# Patient Record
Sex: Female | Born: 1975
Health system: Southern US, Community
[De-identification: ages and names within clinical notes are randomized; demographics above are authoritative.]

## PROBLEM LIST (undated history)

## (undated) DIAGNOSIS — G43909 Migraine, unspecified, not intractable, without status migrainosus: Secondary | ICD-10-CM

## (undated) DIAGNOSIS — R350 Frequency of micturition: Secondary | ICD-10-CM

## (undated) DIAGNOSIS — J45909 Unspecified asthma, uncomplicated: Secondary | ICD-10-CM

## (undated) DIAGNOSIS — C801 Malignant (primary) neoplasm, unspecified: Secondary | ICD-10-CM

## (undated) DIAGNOSIS — R351 Nocturia: Secondary | ICD-10-CM

## (undated) DIAGNOSIS — R3915 Urgency of urination: Secondary | ICD-10-CM

## (undated) DIAGNOSIS — F419 Anxiety disorder, unspecified: Secondary | ICD-10-CM

## (undated) DIAGNOSIS — B977 Papillomavirus as the cause of diseases classified elsewhere: Secondary | ICD-10-CM

## (undated) DIAGNOSIS — Z87442 Personal history of urinary calculi: Secondary | ICD-10-CM

## (undated) DIAGNOSIS — Z973 Presence of spectacles and contact lenses: Secondary | ICD-10-CM

## (undated) DIAGNOSIS — G40909 Epilepsy, unspecified, not intractable, without status epilepticus: Secondary | ICD-10-CM

## (undated) DIAGNOSIS — Z9889 Other specified postprocedural states: Secondary | ICD-10-CM

## (undated) DIAGNOSIS — N398 Other specified disorders of urinary system: Secondary | ICD-10-CM

## (undated) DIAGNOSIS — Z8541 Personal history of malignant neoplasm of cervix uteri: Secondary | ICD-10-CM

## (undated) DIAGNOSIS — M1990 Unspecified osteoarthritis, unspecified site: Secondary | ICD-10-CM

## (undated) DIAGNOSIS — R3989 Other symptoms and signs involving the genitourinary system: Secondary | ICD-10-CM

## (undated) DIAGNOSIS — G629 Polyneuropathy, unspecified: Secondary | ICD-10-CM

## (undated) DIAGNOSIS — N281 Cyst of kidney, acquired: Secondary | ICD-10-CM

## (undated) DIAGNOSIS — F909 Attention-deficit hyperactivity disorder, unspecified type: Secondary | ICD-10-CM

## (undated) DIAGNOSIS — Z87820 Personal history of traumatic brain injury: Secondary | ICD-10-CM

## (undated) DIAGNOSIS — Z85820 Personal history of malignant melanoma of skin: Secondary | ICD-10-CM

## (undated) HISTORY — PX: OTHER SURGICAL HISTORY: SHX169

## (undated) HISTORY — DX: Polyneuropathy, unspecified: G62.9

## (undated) HISTORY — PX: TONSILLECTOMY AND ADENOIDECTOMY: SUR1326

## (undated) HISTORY — DX: Migraine, unspecified, not intractable, without status migrainosus: G43.909

## (undated) HISTORY — DX: Epilepsy, unspecified, not intractable, without status epilepticus: G40.909

## (undated) HISTORY — DX: Malignant (primary) neoplasm, unspecified: C80.1

## (undated) HISTORY — PX: CERVICAL CONIZATION W/BX: SHX1330

## (undated) HISTORY — PX: WISDOM TOOTH EXTRACTION: SHX21

---
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
## 1998-09-30 ENCOUNTER — Inpatient Hospital Stay (HOSPITAL_COMMUNITY): Admission: AD | Admit: 1998-09-30 | Discharge: 1998-09-30 | Payer: Self-pay | Admitting: Obstetrics

## 1998-10-02 ENCOUNTER — Ambulatory Visit (HOSPITAL_COMMUNITY): Admission: RE | Admit: 1998-10-02 | Discharge: 1998-10-02 | Payer: Self-pay | Admitting: *Deleted

## 1998-10-04 ENCOUNTER — Ambulatory Visit (HOSPITAL_COMMUNITY): Admission: RE | Admit: 1998-10-04 | Discharge: 1998-10-04 | Payer: Self-pay | Admitting: Obstetrics & Gynecology

## 1998-10-08 ENCOUNTER — Inpatient Hospital Stay (HOSPITAL_COMMUNITY): Admission: AD | Admit: 1998-10-08 | Discharge: 1998-10-08 | Payer: Self-pay | Admitting: Obstetrics & Gynecology

## 1998-10-08 ENCOUNTER — Encounter: Payer: Self-pay | Admitting: Obstetrics & Gynecology

## 1998-10-14 ENCOUNTER — Ambulatory Visit (HOSPITAL_COMMUNITY): Admission: RE | Admit: 1998-10-14 | Discharge: 1998-10-14 | Payer: Self-pay | Admitting: Obstetrics

## 1998-10-25 ENCOUNTER — Other Ambulatory Visit: Admission: RE | Admit: 1998-10-25 | Discharge: 1998-10-25 | Payer: Self-pay | Admitting: Obstetrics & Gynecology

## 1998-10-27 ENCOUNTER — Ambulatory Visit (HOSPITAL_COMMUNITY): Admission: RE | Admit: 1998-10-27 | Discharge: 1998-10-27 | Payer: Self-pay | Admitting: Obstetrics & Gynecology

## 1998-10-27 ENCOUNTER — Encounter: Payer: Self-pay | Admitting: Obstetrics & Gynecology

## 1999-02-09 ENCOUNTER — Inpatient Hospital Stay (HOSPITAL_COMMUNITY): Admission: AD | Admit: 1999-02-09 | Discharge: 1999-02-11 | Payer: Self-pay | Admitting: *Deleted

## 1999-02-10 ENCOUNTER — Encounter: Payer: Self-pay | Admitting: Obstetrics & Gynecology

## 1999-05-03 ENCOUNTER — Inpatient Hospital Stay (HOSPITAL_COMMUNITY): Admission: AD | Admit: 1999-05-03 | Discharge: 1999-05-06 | Payer: Self-pay | Admitting: Obstetrics and Gynecology

## 2000-10-21 ENCOUNTER — Inpatient Hospital Stay (HOSPITAL_COMMUNITY): Admission: AD | Admit: 2000-10-21 | Discharge: 2000-10-21 | Payer: Self-pay | Admitting: Obstetrics & Gynecology

## 2000-12-21 ENCOUNTER — Emergency Department (HOSPITAL_COMMUNITY): Admission: EM | Admit: 2000-12-21 | Discharge: 2000-12-21 | Payer: Self-pay | Admitting: Emergency Medicine

## 2000-12-22 ENCOUNTER — Encounter: Payer: Self-pay | Admitting: Emergency Medicine

## 2000-12-29 ENCOUNTER — Inpatient Hospital Stay (HOSPITAL_COMMUNITY): Admission: AD | Admit: 2000-12-29 | Discharge: 2000-12-29 | Payer: Self-pay | Admitting: *Deleted

## 2001-02-07 ENCOUNTER — Inpatient Hospital Stay (HOSPITAL_COMMUNITY): Admission: AD | Admit: 2001-02-07 | Discharge: 2001-02-07 | Payer: Self-pay | Admitting: Obstetrics & Gynecology

## 2001-02-10 ENCOUNTER — Inpatient Hospital Stay (HOSPITAL_COMMUNITY): Admission: AD | Admit: 2001-02-10 | Discharge: 2001-02-10 | Payer: Self-pay | Admitting: Obstetrics

## 2001-02-12 ENCOUNTER — Other Ambulatory Visit: Admission: RE | Admit: 2001-02-12 | Discharge: 2001-02-12 | Payer: Self-pay | Admitting: Obstetrics & Gynecology

## 2001-02-19 ENCOUNTER — Inpatient Hospital Stay (HOSPITAL_COMMUNITY): Admission: AD | Admit: 2001-02-19 | Discharge: 2001-02-19 | Payer: Self-pay | Admitting: Obstetrics and Gynecology

## 2002-02-18 ENCOUNTER — Emergency Department (HOSPITAL_COMMUNITY): Admission: EM | Admit: 2002-02-18 | Discharge: 2002-02-18 | Payer: Self-pay | Admitting: Emergency Medicine

## 2002-02-18 ENCOUNTER — Encounter: Payer: Self-pay | Admitting: Emergency Medicine

## 2002-04-17 ENCOUNTER — Other Ambulatory Visit: Admission: RE | Admit: 2002-04-17 | Discharge: 2002-04-17 | Payer: Self-pay | Admitting: Obstetrics and Gynecology

## 2002-06-19 ENCOUNTER — Encounter: Admission: RE | Admit: 2002-06-19 | Discharge: 2002-06-19 | Payer: Self-pay | Admitting: *Deleted

## 2002-07-03 ENCOUNTER — Ambulatory Visit (HOSPITAL_COMMUNITY): Admission: RE | Admit: 2002-07-03 | Discharge: 2002-07-03 | Payer: Self-pay

## 2002-07-10 ENCOUNTER — Encounter: Admission: RE | Admit: 2002-07-10 | Discharge: 2002-07-10 | Payer: Self-pay | Admitting: *Deleted

## 2002-07-18 ENCOUNTER — Inpatient Hospital Stay (HOSPITAL_COMMUNITY): Admission: AD | Admit: 2002-07-18 | Discharge: 2002-07-18 | Payer: Self-pay | Admitting: Obstetrics and Gynecology

## 2002-08-04 ENCOUNTER — Inpatient Hospital Stay (HOSPITAL_COMMUNITY): Admission: AD | Admit: 2002-08-04 | Discharge: 2002-08-04 | Payer: Self-pay | Admitting: *Deleted

## 2002-08-14 ENCOUNTER — Encounter: Admission: RE | Admit: 2002-08-14 | Discharge: 2002-08-14 | Payer: Self-pay | Admitting: *Deleted

## 2002-08-21 ENCOUNTER — Inpatient Hospital Stay (HOSPITAL_COMMUNITY): Admission: AD | Admit: 2002-08-21 | Discharge: 2002-08-21 | Payer: Self-pay | Admitting: *Deleted

## 2002-08-23 ENCOUNTER — Encounter: Payer: Self-pay | Admitting: *Deleted

## 2002-08-23 ENCOUNTER — Emergency Department (HOSPITAL_COMMUNITY): Admission: EM | Admit: 2002-08-23 | Discharge: 2002-08-23 | Payer: Self-pay | Admitting: *Deleted

## 2002-09-04 ENCOUNTER — Encounter: Admission: RE | Admit: 2002-09-04 | Discharge: 2002-09-04 | Payer: Self-pay | Admitting: *Deleted

## 2002-10-02 ENCOUNTER — Encounter: Admission: RE | Admit: 2002-10-02 | Discharge: 2002-10-02 | Payer: Self-pay | Admitting: *Deleted

## 2002-10-06 ENCOUNTER — Ambulatory Visit (HOSPITAL_COMMUNITY): Admission: RE | Admit: 2002-10-06 | Discharge: 2002-10-06 | Payer: Self-pay | Admitting: *Deleted

## 2002-10-21 ENCOUNTER — Inpatient Hospital Stay (HOSPITAL_COMMUNITY): Admission: AD | Admit: 2002-10-21 | Discharge: 2002-10-21 | Payer: Self-pay | Admitting: Obstetrics and Gynecology

## 2002-10-23 HISTORY — PX: TUBAL LIGATION: SHX77

## 2002-10-30 ENCOUNTER — Inpatient Hospital Stay (HOSPITAL_COMMUNITY): Admission: AD | Admit: 2002-10-30 | Discharge: 2002-10-30 | Payer: Self-pay | Admitting: Obstetrics and Gynecology

## 2002-11-06 ENCOUNTER — Encounter: Admission: RE | Admit: 2002-11-06 | Discharge: 2002-11-06 | Payer: Self-pay | Admitting: *Deleted

## 2002-11-08 ENCOUNTER — Inpatient Hospital Stay (HOSPITAL_COMMUNITY): Admission: AD | Admit: 2002-11-08 | Discharge: 2002-11-11 | Payer: Self-pay | Admitting: Family Medicine

## 2002-11-12 ENCOUNTER — Encounter: Admission: RE | Admit: 2002-11-12 | Discharge: 2002-12-12 | Payer: Self-pay | Admitting: Certified Nurse Midwife

## 2002-12-13 ENCOUNTER — Encounter: Admission: RE | Admit: 2002-12-13 | Discharge: 2003-01-12 | Payer: Self-pay | Admitting: Certified Nurse Midwife

## 2003-02-11 ENCOUNTER — Encounter: Admission: RE | Admit: 2003-02-11 | Discharge: 2003-03-13 | Payer: Self-pay | Admitting: Certified Nurse Midwife

## 2003-04-13 ENCOUNTER — Encounter: Admission: RE | Admit: 2003-04-13 | Discharge: 2003-05-13 | Payer: Self-pay | Admitting: Certified Nurse Midwife

## 2003-06-13 ENCOUNTER — Encounter: Admission: RE | Admit: 2003-06-13 | Discharge: 2003-07-13 | Payer: Self-pay | Admitting: Neurosurgery

## 2003-07-14 ENCOUNTER — Encounter: Admission: RE | Admit: 2003-07-14 | Discharge: 2003-08-13 | Payer: Self-pay | Admitting: Certified Nurse Midwife

## 2003-09-13 ENCOUNTER — Encounter: Admission: RE | Admit: 2003-09-13 | Discharge: 2003-10-13 | Payer: Self-pay | Admitting: Certified Nurse Midwife

## 2004-10-23 HISTORY — PX: FOOT SURGERY: SHX648

## 2005-10-23 HISTORY — PX: OTHER SURGICAL HISTORY: SHX169

## 2011-04-17 ENCOUNTER — Other Ambulatory Visit: Payer: Self-pay | Admitting: Diagnostic Neuroimaging

## 2011-04-17 DIAGNOSIS — R51 Headache: Secondary | ICD-10-CM

## 2011-04-17 DIAGNOSIS — G43109 Migraine with aura, not intractable, without status migrainosus: Secondary | ICD-10-CM

## 2011-05-02 ENCOUNTER — Ambulatory Visit
Admission: RE | Admit: 2011-05-02 | Discharge: 2011-05-02 | Disposition: A | Discharge status: Home / Self Care | Payer: Medicaid Other | Source: Ambulatory Visit | Attending: Diagnostic Neuroimaging | Admitting: Diagnostic Neuroimaging

## 2011-05-02 DIAGNOSIS — G43109 Migraine with aura, not intractable, without status migrainosus: Secondary | ICD-10-CM

## 2011-05-02 DIAGNOSIS — R51 Headache: Secondary | ICD-10-CM

## 2011-05-02 MED ORDER — GADOBENATE DIMEGLUMINE 529 MG/ML IV SOLN
15.0000 mL | Freq: Once | INTRAVENOUS | Status: AC | PRN
  Administered 2011-05-02: 15 mL via INTRAVENOUS

## 2012-07-05 ENCOUNTER — Other Ambulatory Visit: Payer: Self-pay | Admitting: Neurosurgery

## 2012-07-05 DIAGNOSIS — M48061 Spinal stenosis, lumbar region without neurogenic claudication: Secondary | ICD-10-CM

## 2012-07-12 ENCOUNTER — Inpatient Hospital Stay: Admission: RE | Admit: 2012-07-12 | Payer: Medicaid Other | Source: Ambulatory Visit

## 2012-07-15 ENCOUNTER — Ambulatory Visit
Admission: RE | Admit: 2012-07-15 | Discharge: 2012-07-15 | Disposition: A | Discharge status: Home / Self Care | Payer: Medicaid Other | Source: Ambulatory Visit | Attending: Neurosurgery | Admitting: Neurosurgery

## 2012-07-15 DIAGNOSIS — M48061 Spinal stenosis, lumbar region without neurogenic claudication: Secondary | ICD-10-CM

## 2012-07-18 ENCOUNTER — Other Ambulatory Visit: Payer: Self-pay | Admitting: Urology

## 2012-07-30 ENCOUNTER — Encounter (HOSPITAL_COMMUNITY): Payer: Self-pay | Admitting: Pharmacy Technician

## 2012-08-13 ENCOUNTER — Encounter (HOSPITAL_COMMUNITY): Payer: Self-pay | Admitting: *Deleted

## 2012-08-13 NOTE — Pre-Procedure Instructions (Signed)
Asked to bring blue folder the day of the procedure,insurance card,I.D. driver's license,wear comfortable clothing and have a driver for the day. Asked not to take Advil,Motrin,Ibuprofen,Aleve or any NSAIDS, Aspirin, or Toradol for 72 hours prior to procedure,  No vitamins or herbal medications 7 days prior to procedure. Will stop vitamins today Instructed to take laxative per doctor's office instructions and eat a light dinner the evening before procedure.   To arrive at 0530 for lithotripsy procedure.
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
## 2012-08-19 ENCOUNTER — Ambulatory Visit (HOSPITAL_COMMUNITY)
Admission: RE | Admit: 2012-08-19 | Discharge: 2012-08-19 | Disposition: A | Discharge status: Home / Self Care | Payer: Medicaid Other | Source: Ambulatory Visit | Attending: Urology | Admitting: Urology

## 2012-08-19 ENCOUNTER — Encounter (HOSPITAL_COMMUNITY): Payer: Self-pay | Admitting: *Deleted

## 2012-08-19 ENCOUNTER — Ambulatory Visit (HOSPITAL_COMMUNITY): Payer: Medicaid Other

## 2012-08-19 ENCOUNTER — Encounter (HOSPITAL_COMMUNITY)
Admission: RE | Disposition: A | Discharge status: Home / Self Care | Payer: Self-pay | Source: Ambulatory Visit | Attending: Urology

## 2012-08-19 DIAGNOSIS — N200 Calculus of kidney: Secondary | ICD-10-CM | POA: Insufficient documentation

## 2012-08-19 DIAGNOSIS — Z79899 Other long term (current) drug therapy: Secondary | ICD-10-CM | POA: Insufficient documentation

## 2012-08-19 DIAGNOSIS — Z87440 Personal history of urinary (tract) infections: Secondary | ICD-10-CM | POA: Insufficient documentation

## 2012-08-19 DIAGNOSIS — K219 Gastro-esophageal reflux disease without esophagitis: Secondary | ICD-10-CM | POA: Insufficient documentation

## 2012-08-19 DIAGNOSIS — R82998 Other abnormal findings in urine: Secondary | ICD-10-CM | POA: Insufficient documentation

## 2012-08-19 DIAGNOSIS — E119 Type 2 diabetes mellitus without complications: Secondary | ICD-10-CM | POA: Insufficient documentation

## 2012-08-19 DIAGNOSIS — M545 Low back pain, unspecified: Secondary | ICD-10-CM | POA: Insufficient documentation

## 2012-08-19 HISTORY — DX: Unspecified asthma, uncomplicated: J45.909

## 2012-08-19 HISTORY — DX: Unspecified osteoarthritis, unspecified site: M19.90

## 2012-08-19 LAB — URINE MICROSCOPIC-ADD ON

## 2012-08-19 LAB — URINALYSIS, ROUTINE W REFLEX MICROSCOPIC
Bilirubin Urine: NEGATIVE
Specific Gravity, Urine: 1.03 (ref 1.005–1.030)
Urobilinogen, UA: 0.2 mg/dL (ref 0.0–1.0)
pH: 6.5 (ref 5.0–8.0)

## 2012-08-19 SURGERY — LITHOTRIPSY, ESWL
Anesthesia: LOCAL | Laterality: Right

## 2012-08-19 MED ORDER — DEXTROSE-NACL 5-0.45 % IV SOLN
INTRAVENOUS | Status: DC
  Administered 2012-08-19: 07:00:00 via INTRAVENOUS

## 2012-08-19 MED ORDER — DIPHENHYDRAMINE HCL 25 MG PO CAPS
25.0000 mg | ORAL_CAPSULE | ORAL | Status: AC
  Administered 2012-08-19: 25 mg via ORAL
  Filled 2012-08-19: qty 1

## 2012-08-19 MED ORDER — CIPROFLOXACIN HCL 500 MG PO TABS
500.0000 mg | ORAL_TABLET | ORAL | Status: AC
  Administered 2012-08-19: 500 mg via ORAL
  Filled 2012-08-19: qty 1

## 2012-08-19 MED ORDER — DIAZEPAM 5 MG PO TABS
10.0000 mg | ORAL_TABLET | ORAL | Status: AC
  Administered 2012-08-19: 10 mg via ORAL
  Filled 2012-08-19: qty 2

## 2012-08-19 MED ORDER — NITROFURANTOIN MONOHYD MACRO 100 MG PO CAPS: 100.0000 mg | ORAL_CAPSULE | Freq: Two times a day (BID) | ORAL | Status: DC

## 2012-08-19 NOTE — Op Note (Signed)
Her procedure was cancelled because she went off of her suppressive antibiotic about a week ago and her UA today is worrisome for infection.     She will be sent out on macrobid 100mg  po bid #30 pending the culture and will be rescheduled for the ESWL.

## 2012-08-19 NOTE — Progress Notes (Signed)
Dr. Annabell Howells called from litho truck at approximately 0740 and requested a u/a and culture to be done on patient prior to litho (due to patient not taking her antibiotic up until litho day). Called the lab and they added on above labs to pregnancy test urine sent down this am prior to premed of Cipro. Results available at [phone] and I called them to Dr. Annabell Howells. Per Dr. Annabell Howells verbal order given to d/c patient to home and office will call her to reschedule litho. Patient given new script for Macrobid. Patient able to verbalize back importance of taking antibiotic. She will call office if any problems or questions.

## 2012-08-20 ENCOUNTER — Other Ambulatory Visit: Payer: Self-pay | Admitting: Urology

## 2012-08-23 ENCOUNTER — Encounter (HOSPITAL_COMMUNITY): Payer: Self-pay | Admitting: Pharmacy Technician

## 2012-08-27 ENCOUNTER — Encounter (HOSPITAL_COMMUNITY): Payer: Self-pay | Admitting: *Deleted

## 2012-08-27 NOTE — Pre-Procedure Instructions (Addendum)
Asked to bring blue folder the day of the procedure,insurance card,I.D. driver's license,wear comfortable clothing and have a driver for the day. Asked not to take Advil,Motrin,Ibuprofen,Aleve or any NSAIDS, Aspirin, or Toradol for 72 hours prior to procedure,  No vitamins or herbal medications 7 days prior to procedure. Instructed to take laxative per doctor's office instructions and eat a light dinner the evening before procedure.   To arrive at 0530 for lithotripsy procedure.  Made sure she all instructions were repeated back as I gave them during the pre procedure call all instructions repeated back with clarity.  Dr. Annabell Howells wants her to take her Mircobid Thursday morning before coming in for the procedure because of her history of kidney infections.

## 2012-09-05 ENCOUNTER — Encounter (HOSPITAL_COMMUNITY): Payer: Self-pay | Admitting: *Deleted

## 2012-09-05 ENCOUNTER — Ambulatory Visit (HOSPITAL_COMMUNITY): Payer: Medicaid Other

## 2012-09-05 ENCOUNTER — Encounter (HOSPITAL_COMMUNITY)
Admission: RE | Disposition: A | Discharge status: Home / Self Care | Payer: Self-pay | Source: Ambulatory Visit | Attending: Urology

## 2012-09-05 ENCOUNTER — Ambulatory Visit (HOSPITAL_COMMUNITY)
Admission: RE | Admit: 2012-09-05 | Discharge: 2012-09-05 | Disposition: A | Discharge status: Home / Self Care | Payer: Medicaid Other | Source: Ambulatory Visit | Attending: Urology | Admitting: Urology

## 2012-09-05 DIAGNOSIS — E119 Type 2 diabetes mellitus without complications: Secondary | ICD-10-CM | POA: Insufficient documentation

## 2012-09-05 DIAGNOSIS — J45909 Unspecified asthma, uncomplicated: Secondary | ICD-10-CM | POA: Insufficient documentation

## 2012-09-05 DIAGNOSIS — Z79899 Other long term (current) drug therapy: Secondary | ICD-10-CM | POA: Insufficient documentation

## 2012-09-05 DIAGNOSIS — K219 Gastro-esophageal reflux disease without esophagitis: Secondary | ICD-10-CM | POA: Insufficient documentation

## 2012-09-05 DIAGNOSIS — N200 Calculus of kidney: Secondary | ICD-10-CM | POA: Insufficient documentation

## 2012-09-05 DIAGNOSIS — Z87440 Personal history of urinary (tract) infections: Secondary | ICD-10-CM | POA: Insufficient documentation

## 2012-09-05 HISTORY — PX: EXTRACORPOREAL SHOCK WAVE LITHOTRIPSY: SHX1557

## 2012-09-05 LAB — PREGNANCY, URINE: Preg Test, Ur: NEGATIVE

## 2012-09-05 SURGERY — LITHOTRIPSY, ESWL
Anesthesia: LOCAL | Laterality: Right

## 2012-09-05 MED ORDER — HYDROMORPHONE HCL 2 MG PO TABS: 2.0000 mg | ORAL_TABLET | ORAL | Status: DC | PRN

## 2012-09-05 MED ORDER — DEXTROSE-NACL 5-0.45 % IV SOLN
INTRAVENOUS | Status: DC
  Administered 2012-09-05: 1000 mL via INTRAVENOUS

## 2012-09-05 MED ORDER — DIPHENHYDRAMINE HCL 25 MG PO CAPS
25.0000 mg | ORAL_CAPSULE | ORAL | Status: AC
  Administered 2012-09-05: 25 mg via ORAL
  Filled 2012-09-05: qty 1

## 2012-09-05 MED ORDER — CIPROFLOXACIN HCL 500 MG PO TABS
500.0000 mg | ORAL_TABLET | ORAL | Status: AC
  Administered 2012-09-05: 500 mg via ORAL
  Filled 2012-09-05: qty 1

## 2012-09-05 MED ORDER — DIAZEPAM 5 MG PO TABS
10.0000 mg | ORAL_TABLET | ORAL | Status: AC
  Administered 2012-09-05: 10 mg via ORAL
  Filled 2012-09-05: qty 2

## 2012-09-05 NOTE — Interval H&P Note (Signed)
History and Physical Interval Note:  09/05/2012 7:42 AM  Hannah Moreno  has presented today for surgery, with the diagnosis of RIGHT RENAL STONE  The various methods of treatment have been discussed with the patient and family. After consideration of risks, benefits and other options for treatment, the patient has consented to  Procedure(s) (LRB) with comments: EXTRACORPOREAL SHOCK WAVE LITHOTRIPSY (ESWL) (Right) as a surgical intervention .  The patient's history has been reviewed, patient examined, no change in status, stable for surgery.  I have reviewed the patient's chart and labs.  Questions were answered to the patient's satisfaction.     Tyshana Nishida J  Her culture grew only Mx species
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
## 2012-09-07 ENCOUNTER — Encounter (HOSPITAL_COMMUNITY): Payer: Self-pay | Admitting: Emergency Medicine

## 2012-09-07 ENCOUNTER — Emergency Department (HOSPITAL_COMMUNITY)
Admission: EM | Admit: 2012-09-07 | Discharge: 2012-09-08 | Disposition: A | Discharge status: Home / Self Care | Payer: Medicaid Other | Attending: Emergency Medicine | Admitting: Emergency Medicine

## 2012-09-07 DIAGNOSIS — R300 Dysuria: Secondary | ICD-10-CM | POA: Insufficient documentation

## 2012-09-07 DIAGNOSIS — R109 Unspecified abdominal pain: Secondary | ICD-10-CM

## 2012-09-07 LAB — CBC WITH DIFFERENTIAL/PLATELET
Eosinophils Relative: 2 % (ref 0–5)
HCT: 38.2 % (ref 36.0–46.0)
Hemoglobin: 12.6 g/dL (ref 12.0–15.0)
Lymphocytes Relative: 20 % (ref 12–46)
Lymphs Abs: 1.4 10*3/uL (ref 0.7–4.0)
MCV: 91.6 fL (ref 78.0–100.0)
Monocytes Relative: 7 % (ref 3–12)
Platelets: 285 10*3/uL (ref 150–400)
RBC: 4.17 MIL/uL (ref 3.87–5.11)
WBC: 7.2 10*3/uL (ref 4.0–10.5)

## 2012-09-07 LAB — URINE MICROSCOPIC-ADD ON

## 2012-09-07 LAB — URINALYSIS, ROUTINE W REFLEX MICROSCOPIC
Bilirubin Urine: NEGATIVE
Glucose, UA: NEGATIVE mg/dL
Ketones, ur: NEGATIVE mg/dL
Nitrite: NEGATIVE
Specific Gravity, Urine: 1.017 (ref 1.005–1.030)
pH: 5.5 (ref 5.0–8.0)

## 2012-09-07 LAB — BASIC METABOLIC PANEL
BUN: 10 mg/dL (ref 6–23)
CO2: 24 mEq/L (ref 19–32)
Calcium: 8.8 mg/dL (ref 8.4–10.5)
Glucose, Bld: 69 mg/dL — ABNORMAL LOW (ref 70–99)
Sodium: 136 mEq/L (ref 135–145)

## 2012-09-07 MED ORDER — HYDROMORPHONE HCL PF 1 MG/ML IJ SOLN
1.0000 mg | Freq: Once | INTRAMUSCULAR | Status: AC
  Administered 2012-09-08: 1 mg via INTRAMUSCULAR
  Filled 2012-09-07: qty 1

## 2012-09-07 NOTE — ED Notes (Signed)
Awaiting evaluation by ED provider.  

## 2012-09-07 NOTE — ED Notes (Signed)
Pt alert, arrives from home, c/o painful urination, onset several days ago, recent lithotripsy, resp even unlabored skin pwd, c/o mild flank pain

## 2012-09-07 NOTE — ED Notes (Signed)
Pt states pain since 0400 Friday after lithotripsy Thursday.  Burning and frequency and abdominal cramping with urination, as well as right and left sided flank pain.  Pt denies n/v, and states LBM Wednesday.  Pt states sediment found in urine on Thursday but not since increase in urination with burning and pain.
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
## 2012-09-08 MED ORDER — KETOROLAC TROMETHAMINE 60 MG/2ML IM SOLN
60.0000 mg | Freq: Once | INTRAMUSCULAR | Status: AC
  Administered 2012-09-08: 60 mg via INTRAMUSCULAR
  Filled 2012-09-08: qty 2

## 2012-09-08 MED ORDER — HYDROMORPHONE HCL 2 MG PO TABS: 2.0000 mg | ORAL_TABLET | ORAL | Status: DC | PRN

## 2012-09-08 MED ORDER — IBUPROFEN 800 MG PO TABS: 800.0000 mg | ORAL_TABLET | Freq: Three times a day (TID) | ORAL | Status: DC

## 2012-09-08 NOTE — ED Provider Notes (Signed)
Medical screening examination/treatment/procedure(s) were performed by non-physician practitioner and as supervising physician I was immediately available for consultation/collaboration.    Vida Roller, MD 09/08/12 [phone]

## 2012-10-23 HISTORY — PX: LITHOTRIPSY: SUR834

## 2013-12-29 ENCOUNTER — Encounter (HOSPITAL_COMMUNITY): Payer: Self-pay | Admitting: Emergency Medicine

## 2013-12-29 ENCOUNTER — Emergency Department (HOSPITAL_COMMUNITY): Payer: Medicaid Other

## 2013-12-29 ENCOUNTER — Emergency Department (HOSPITAL_COMMUNITY)
Admission: EM | Admit: 2013-12-29 | Discharge: 2013-12-29 | Disposition: A | Discharge status: Home / Self Care | Payer: Medicaid Other | Attending: Emergency Medicine | Admitting: Emergency Medicine

## 2013-12-29 DIAGNOSIS — Z79899 Other long term (current) drug therapy: Secondary | ICD-10-CM | POA: Insufficient documentation

## 2013-12-29 DIAGNOSIS — N189 Chronic kidney disease, unspecified: Secondary | ICD-10-CM | POA: Insufficient documentation

## 2013-12-29 DIAGNOSIS — G40909 Epilepsy, unspecified, not intractable, without status epilepticus: Secondary | ICD-10-CM | POA: Insufficient documentation

## 2013-12-29 DIAGNOSIS — IMO0002 Reserved for concepts with insufficient information to code with codable children: Secondary | ICD-10-CM | POA: Insufficient documentation

## 2013-12-29 DIAGNOSIS — J45909 Unspecified asthma, uncomplicated: Secondary | ICD-10-CM | POA: Insufficient documentation

## 2013-12-29 DIAGNOSIS — M129 Arthropathy, unspecified: Secondary | ICD-10-CM | POA: Insufficient documentation

## 2013-12-29 DIAGNOSIS — R229 Localized swelling, mass and lump, unspecified: Secondary | ICD-10-CM | POA: Insufficient documentation

## 2013-12-29 DIAGNOSIS — M940 Chondrocostal junction syndrome [Tietze]: Secondary | ICD-10-CM | POA: Insufficient documentation

## 2013-12-29 MED ORDER — CYCLOBENZAPRINE HCL 10 MG PO TABS: 10.0000 mg | ORAL_TABLET | Freq: Two times a day (BID) | ORAL | Status: DC | PRN

## 2013-12-29 MED ORDER — OXYCODONE-ACETAMINOPHEN 5-325 MG PO TABS: 1.0000 | ORAL_TABLET | ORAL | Status: DC | PRN

## 2013-12-29 MED ORDER — IBUPROFEN 600 MG PO TABS: 600.0000 mg | ORAL_TABLET | Freq: Four times a day (QID) | ORAL | Status: DC | PRN

## 2013-12-29 NOTE — ED Notes (Signed)
Pt presents with c/o left shoulder pain and a knot on the upper left part of her chest. Pt says her left shoulder started hurting the end of January, no injury noted. Pt noticed a knot on her chest last week and says that it is painful to touch and painful when she leans back.
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
## 2013-12-29 NOTE — Discharge Instructions (Signed)

## 2014-01-02 ENCOUNTER — Other Ambulatory Visit: Payer: Self-pay | Admitting: Urology

## 2014-01-07 ENCOUNTER — Encounter (HOSPITAL_BASED_OUTPATIENT_CLINIC_OR_DEPARTMENT_OTHER): Payer: Self-pay | Admitting: *Deleted

## 2014-01-09 ENCOUNTER — Encounter (HOSPITAL_BASED_OUTPATIENT_CLINIC_OR_DEPARTMENT_OTHER): Payer: Self-pay | Admitting: *Deleted

## 2014-01-09 NOTE — Progress Notes (Signed)
NPO AFTER MN. ARRIVE AT 0800. NEEDS HG. WILL TAKE PROTONIX, TOPAMAX AND IF NEEDED OXYCODONE AM DOS W/ SIPS OF WATER. WILL BRING RESCUE INHALER.

## 2014-01-14 NOTE — H&P (Signed)
ctive Problems Problems   1. Bladder pain (788.99)  2. Lumbago (724.2)  3. Nephrolithiasis (592.0)  4. Urge incontinence of urine (788.31)  5. History of Urinary Tract Infection (V13.02)  History of Present Illness  Hannah Moreno returns today in f/u from [facility].  She had some right back pain but her UA was clear and a KUB showed no recurrent stones on 3/10.   She has bladder pain with difficulty voiding along with frequency and nocturia.   She was found to have a 200cc capacity bladder with sensory urgency but no instability and pain with filling.   She had a weak valsalva but didn't leak with a pressure to 73cc.   She had a PF of 4cc/sec with a Pdet of 57cm and she strained to void with a pattern that suggests dysfunctional voiding.   She probably has IC.   Past Medical History Problems   1. History of Anxiety (300.00)  2. History of Arthritis (V13.4)  3. History of Asthma (493.90)  4. History of Cervical Cancer (V10.41)  5. History of diabetes mellitus (V12.29)  6. History of esophageal reflux (V12.79)  7. History of kidney stones (V13.01)  8. History of Human papilloma virus infection (079.4)  9. History of Malignant Melanoma Of The Skin (V10.82)  10. History of Nephrolithiasis Of The Right Kidney (V13.01)  11. History of Seizure  12. History of Urinary Tract Infection (V13.02)  Surgical History Problems   1. History of Ankle Surgery  2. History of Biopsy Skin  3. History of Cervical Surgery (Gyn)  4. History of Cervical Surgery (Gyn)  5. History of Knee Surgery  6. History of Leg Repair  7. History of Lithotripsy  8. History of Shoulder Surgery  9. History of Tonsillectomy With Adenoidectomy  Current Meds  1. Allegra CAPS;  Therapy: (Recorded:25Sep2013) to Recorded  2. Biotin TABS;  Therapy: (Recorded:10Jul2014) to Recorded  3. Calcium + D TABS;  Therapy: (Recorded:10Jul2014) to Recorded  4. Cranberry TABS;  Therapy: (Recorded:10Jul2014) to Recorded  5. Flexeril TABS;  Therapy: (Recorded:12Mar2015) to Recorded  6. Ibuprofen 800 MG Oral Tablet; TAKE 1 TABLET EVERY 8 HOURS AS NEEDED;  Therapy: [id number] to (Evaluate:22Mar2014); Last Rx:22Nov2013 Ordered  7. Iron TABS;  Therapy: (Recorded:10Jul2014) to Recorded  8. Melatonin CAPS;  Therapy: (Recorded:10Jul2014) to Recorded  9. Multi-Vitamin TABS;  Therapy: (Recorded:10Jul2014) to Recorded  10. Oxycodone-Acetaminophen 5-325 MG Oral Tablet;   Therapy: (Recorded:25Sep2013) to Recorded  11. Protonix 20 MG Oral Tablet Delayed Release;   Therapy: (Recorded:25Sep2013) to Recorded  12. Ritalin 20 MG Oral Tablet;   Therapy: (Recorded:25Sep2013) to Recorded  13. Singulair 10 MG Oral Tablet;   Therapy: (Recorded:25Sep2013) to Recorded  14. Symbicort 160-4.5 MCG/ACT Inhalation Aerosol;   Therapy: (Recorded:25Sep2013) to Recorded  15. Topiramate 100 MG Oral Tablet;   Therapy: (Recorded:25Sep2013) to Recorded  16. Vitamin B-12 TABS;   Therapy: (Recorded:10Jul2014) to Recorded  17. Vitamin C TABS;   Therapy: (Recorded:10Jul2014) to Recorded  18. Vitamin D TABS;   Therapy: (Recorded:12Mar2015) to Recorded  19. Xanax 0.5 MG Oral Tablet;   Therapy: (Recorded:25Sep2013) to Recorded  20. Zinc TABS;   Therapy: (Recorded:12Mar2015) to Recorded  21. Zyrtec 10 MG TABS;   Therapy: (Recorded:25Sep2013) to Recorded  Allergies Medication   1. Hydrocodone-Acetaminophen CAPS  2. Sulfa Drugs  Family History Problems   1. Family history of Blood In Urine  2. Family history of Breast Cancer (V16.3) : Maternal Grandmother  3. Family history of Colon Cancer (V16.0) : Mother  4. Family  history of Emphysema : Father  5. Family history of Family Health Status - Mother's Age   age 51  6. Family history of Family Health Status Number Of Children   1 son 1 daughter  80. Family history of Kidney Cancer (V16.51) : Maternal Aunt  8. Family history of Liver Cancer : Maternal Aunt  9. Family history of Nephrolithiasis  10.  Family history of Nonfunctioning Kidney : Maternal Aunt  11. Family history of Prostate Cancer (P82.42) : Maternal Uncle  12. Family history of Sickle Cell Trait : Maternal Aunt  13. Family history of Tuberculosis : Father  Social History Problems   1. Denied: History of Alcohol Use  2. Caffeine Use   2 per day  3. Marital History - Single  4. Never A Smoker  5. Unemployed   filing for disability.   Past and social history reviewed and updated.   Review of Systems  Genitourinary: urinary frequency, urinary urgency, nocturia, incontinence, urinary stream starts and stops, incomplete emptying of bladder and initiating urination requires straining.  Constitutional: no fever.  Cardiovascular: chest pain (from musculoskeletal issues).  Respiratory: no shortness of breath.  Musculoskeletal: back pain.    Vitals Vital Signs [Data Includes: Last 1 Day]  Recorded: [id number] 04:18PM  Blood Pressure: 116 / 76 Temperature: 98.5 F Heart Rate: 99  Physical Exam Constitutional: Well nourished and well developed . No acute distress.  Pulmonary: No respiratory distress and normal respiratory rhythm and effort.  Cardiovascular: Heart rate and rhythm are normal . No peripheral edema.    Results/Data  The following images/tracing/specimen were independently visualized:  I have reviewed the [facility] tracing, fluoro and report.    Assessment Assessed   1. Bladder pain (788.99)  2. Urge incontinence of urine (788.31)   She IC/CPPS with dysfunctional voiding but no recurrent stones.   Plan Bladder pain   1. Start: Toviaz 8 MG Oral Tablet Extended Release 24 Hour; Take 1 tablet daily  2. Follow-up Schedule Surgery Office  Follow-up  Status: Hold For - Appointment   Requested for: [phone]   I discussed the option and will get her set up for cystoscopy with HOD, urethral dilation and instillation of pyridium and marcaine.  I reviewed the risks of bleeding, infection, bladder injury,  retention, increased incontinence, persistent pain, thrombotic events and anesthetic complications.    I have given her Toviaz 8mg  samples to see if that will hold her better than the detrol.   Discussion/Summary   CC: Dortha Kern PA.

## 2014-01-15 ENCOUNTER — Encounter (HOSPITAL_BASED_OUTPATIENT_CLINIC_OR_DEPARTMENT_OTHER): Payer: Medicaid Other | Admitting: Anesthesiology

## 2014-01-15 ENCOUNTER — Ambulatory Visit (HOSPITAL_BASED_OUTPATIENT_CLINIC_OR_DEPARTMENT_OTHER): Payer: Medicaid Other | Admitting: Anesthesiology

## 2014-01-15 ENCOUNTER — Ambulatory Visit (HOSPITAL_BASED_OUTPATIENT_CLINIC_OR_DEPARTMENT_OTHER)
Admission: RE | Admit: 2014-01-15 | Discharge: 2014-01-15 | Disposition: A | Discharge status: Home / Self Care | Payer: Medicaid Other | Source: Ambulatory Visit | Attending: Urology | Admitting: Urology

## 2014-01-15 ENCOUNTER — Encounter (HOSPITAL_BASED_OUTPATIENT_CLINIC_OR_DEPARTMENT_OTHER)
Admission: RE | Disposition: A | Discharge status: Home / Self Care | Payer: Self-pay | Source: Ambulatory Visit | Attending: Urology

## 2014-01-15 ENCOUNTER — Encounter (HOSPITAL_BASED_OUTPATIENT_CLINIC_OR_DEPARTMENT_OTHER): Payer: Self-pay

## 2014-01-15 DIAGNOSIS — Z882 Allergy status to sulfonamides status: Secondary | ICD-10-CM | POA: Insufficient documentation

## 2014-01-15 DIAGNOSIS — Z79899 Other long term (current) drug therapy: Secondary | ICD-10-CM | POA: Insufficient documentation

## 2014-01-15 DIAGNOSIS — E119 Type 2 diabetes mellitus without complications: Secondary | ICD-10-CM | POA: Insufficient documentation

## 2014-01-15 DIAGNOSIS — Z87440 Personal history of urinary (tract) infections: Secondary | ICD-10-CM | POA: Insufficient documentation

## 2014-01-15 DIAGNOSIS — J45909 Unspecified asthma, uncomplicated: Secondary | ICD-10-CM | POA: Insufficient documentation

## 2014-01-15 DIAGNOSIS — M545 Low back pain, unspecified: Secondary | ICD-10-CM | POA: Insufficient documentation

## 2014-01-15 DIAGNOSIS — Z85820 Personal history of malignant melanoma of skin: Secondary | ICD-10-CM | POA: Insufficient documentation

## 2014-01-15 DIAGNOSIS — Z8541 Personal history of malignant neoplasm of cervix uteri: Secondary | ICD-10-CM | POA: Insufficient documentation

## 2014-01-15 DIAGNOSIS — R351 Nocturia: Secondary | ICD-10-CM | POA: Insufficient documentation

## 2014-01-15 DIAGNOSIS — N3010 Interstitial cystitis (chronic) without hematuria: Secondary | ICD-10-CM | POA: Insufficient documentation

## 2014-01-15 DIAGNOSIS — N200 Calculus of kidney: Secondary | ICD-10-CM | POA: Insufficient documentation

## 2014-01-15 DIAGNOSIS — K219 Gastro-esophageal reflux disease without esophagitis: Secondary | ICD-10-CM | POA: Insufficient documentation

## 2014-01-15 DIAGNOSIS — N3941 Urge incontinence: Secondary | ICD-10-CM | POA: Insufficient documentation

## 2014-01-15 HISTORY — DX: Papillomavirus as the cause of diseases classified elsewhere: B97.7

## 2014-01-15 HISTORY — DX: Personal history of urinary calculi: Z87.442

## 2014-01-15 HISTORY — DX: Frequency of micturition: R35.0

## 2014-01-15 HISTORY — PX: CYSTO WITH HYDRODISTENSION: SHX5453

## 2014-01-15 HISTORY — DX: Other specified postprocedural states: Z98.890

## 2014-01-15 HISTORY — DX: Cyst of kidney, acquired: N28.1

## 2014-01-15 HISTORY — DX: Anxiety disorder, unspecified: F41.9

## 2014-01-15 HISTORY — DX: Urgency of urination: R39.15

## 2014-01-15 HISTORY — DX: Presence of spectacles and contact lenses: Z97.3

## 2014-01-15 HISTORY — DX: Other specified disorders of urinary system: N39.8

## 2014-01-15 HISTORY — DX: Other specified postprocedural states: Z85.820

## 2014-01-15 HISTORY — DX: Attention-deficit hyperactivity disorder, unspecified type: F90.9

## 2014-01-15 HISTORY — PX: CYSTOSCOPY WITH URETHRAL DILATATION: SHX5125

## 2014-01-15 HISTORY — DX: Other symptoms and signs involving the genitourinary system: R39.89

## 2014-01-15 HISTORY — DX: Personal history of malignant neoplasm of cervix uteri: Z85.41

## 2014-01-15 HISTORY — DX: Migraine, unspecified, not intractable, without status migrainosus: G43.909

## 2014-01-15 HISTORY — DX: Nocturia: R35.1

## 2014-01-15 HISTORY — DX: Personal history of traumatic brain injury: Z87.820

## 2014-01-15 LAB — POCT HEMOGLOBIN-HEMACUE: Hemoglobin: 13 g/dL (ref 12.0–15.0)

## 2014-01-15 SURGERY — CYSTOSCOPY, WITH URETHRAL DILATION
Anesthesia: General | Site: Ureter

## 2014-01-15 MED ORDER — MIDAZOLAM HCL 2 MG/2ML IJ SOLN
INTRAMUSCULAR | Status: AC
  Filled 2014-01-15: qty 2

## 2014-01-15 MED ORDER — CIPROFLOXACIN IN D5W 400 MG/200ML IV SOLN
INTRAVENOUS | Status: DC | PRN
  Administered 2014-01-15: 400 mg via INTRAVENOUS

## 2014-01-15 MED ORDER — FENTANYL CITRATE 0.05 MG/ML IJ SOLN
25.0000 ug | INTRAMUSCULAR | Status: DC | PRN
  Filled 2014-01-15: qty 1

## 2014-01-15 MED ORDER — ACETAMINOPHEN 325 MG PO TABS
650.0000 mg | ORAL_TABLET | ORAL | Status: DC | PRN
  Filled 2014-01-15: qty 2

## 2014-01-15 MED ORDER — OXYCODONE-ACETAMINOPHEN 5-325 MG PO TABS: 1.0000 | ORAL_TABLET | ORAL | Status: DC | PRN

## 2014-01-15 MED ORDER — PHENAZOPYRIDINE HCL 200 MG PO TABS
ORAL | Status: DC | PRN
  Administered 2014-01-15: 10:00:00 via INTRAVESICAL

## 2014-01-15 MED ORDER — MIDAZOLAM HCL 5 MG/5ML IJ SOLN
INTRAMUSCULAR | Status: DC | PRN
  Administered 2014-01-15: 2 mg via INTRAVENOUS

## 2014-01-15 MED ORDER — SODIUM CHLORIDE 0.9 % IJ SOLN
3.0000 mL | INTRAMUSCULAR | Status: DC | PRN
  Filled 2014-01-15: qty 3

## 2014-01-15 MED ORDER — LACTATED RINGERS IV SOLN
INTRAVENOUS | Status: DC
  Administered 2014-01-15: 09:00:00 via INTRAVENOUS
  Filled 2014-01-15: qty 1000

## 2014-01-15 MED ORDER — CIPROFLOXACIN HCL 500 MG PO TABS: 500.0000 mg | ORAL_TABLET | Freq: Two times a day (BID) | ORAL | Status: DC

## 2014-01-15 MED ORDER — KETOROLAC TROMETHAMINE 30 MG/ML IJ SOLN
INTRAMUSCULAR | Status: DC | PRN
  Administered 2014-01-15: 30 mg via INTRAVENOUS

## 2014-01-15 MED ORDER — SODIUM CHLORIDE 0.9 % IV SOLN
250.0000 mL | INTRAVENOUS | Status: DC | PRN
  Filled 2014-01-15: qty 250

## 2014-01-15 MED ORDER — FENTANYL CITRATE 0.05 MG/ML IJ SOLN
INTRAMUSCULAR | Status: AC
  Filled 2014-01-15: qty 4

## 2014-01-15 MED ORDER — ACETAMINOPHEN 650 MG RE SUPP
650.0000 mg | RECTAL | Status: DC | PRN
  Filled 2014-01-15: qty 1

## 2014-01-15 MED ORDER — FENTANYL CITRATE 0.05 MG/ML IJ SOLN
INTRAMUSCULAR | Status: DC | PRN
  Administered 2014-01-15 (×4): 25 ug via INTRAVENOUS
  Administered 2014-01-15: 50 ug via INTRAVENOUS
  Administered 2014-01-15 (×2): 25 ug via INTRAVENOUS

## 2014-01-15 MED ORDER — PROPOFOL 10 MG/ML IV BOLUS
INTRAVENOUS | Status: DC | PRN
  Administered 2014-01-15: 200 mg via INTRAVENOUS

## 2014-01-15 MED ORDER — LIDOCAINE HCL (CARDIAC) 20 MG/ML IV SOLN
INTRAVENOUS | Status: DC | PRN
  Administered 2014-01-15: 100 mg via INTRAVENOUS

## 2014-01-15 MED ORDER — SODIUM CHLORIDE 0.9 % IJ SOLN
3.0000 mL | Freq: Two times a day (BID) | INTRAMUSCULAR | Status: DC
  Filled 2014-01-15: qty 3

## 2014-01-15 MED ORDER — GLYCOPYRROLATE 0.2 MG/ML IJ SOLN
INTRAMUSCULAR | Status: DC | PRN
  Administered 2014-01-15: 0.2 mg via INTRAVENOUS

## 2014-01-15 MED ORDER — OXYCODONE HCL 5 MG PO TABS
5.0000 mg | ORAL_TABLET | ORAL | Status: DC | PRN
  Filled 2014-01-15: qty 2

## 2014-01-15 MED ORDER — CIPROFLOXACIN IN D5W 400 MG/200ML IV SOLN
400.0000 mg | INTRAVENOUS | Status: DC
  Filled 2014-01-15: qty 200

## 2014-01-15 MED ORDER — DEXAMETHASONE SODIUM PHOSPHATE 4 MG/ML IJ SOLN
INTRAMUSCULAR | Status: DC | PRN
  Administered 2014-01-15: 10 mg via INTRAVENOUS

## 2014-01-15 MED ORDER — KETOROLAC TROMETHAMINE 30 MG/ML IJ SOLN
15.0000 mg | Freq: Once | INTRAMUSCULAR | Status: DC | PRN
  Filled 2014-01-15: qty 1

## 2014-01-15 MED ORDER — PROMETHAZINE HCL 25 MG/ML IJ SOLN
6.2500 mg | INTRAMUSCULAR | Status: DC | PRN
  Filled 2014-01-15: qty 1

## 2014-01-15 MED ORDER — PHENAZOPYRIDINE HCL 200 MG PO TABS: 200.0000 mg | ORAL_TABLET | Freq: Three times a day (TID) | ORAL | Status: DC | PRN

## 2014-01-15 MED ORDER — ONDANSETRON HCL 4 MG/2ML IJ SOLN
INTRAMUSCULAR | Status: DC | PRN
  Administered 2014-01-15: 4 mg via INTRAVENOUS

## 2014-01-15 MED ORDER — STERILE WATER FOR IRRIGATION IR SOLN
Status: DC | PRN
  Administered 2014-01-15: 3000 mL

## 2014-01-15 MED ORDER — LACTATED RINGERS IV SOLN
INTRAVENOUS | Status: DC | PRN
  Administered 2014-01-15: 09:00:00 via INTRAVENOUS

## 2014-01-15 SURGICAL SUPPLY — 29 items
BAG DRAIN URO-CYSTO SKYTR STRL (DRAIN) ×4 IMPLANT
BAG DRN UROCATH (DRAIN) ×2
BALLN NEPHROSTOMY (BALLOONS) ×4
BALLOON NEPHROSTOMY (BALLOONS) ×2 IMPLANT
CANISTER SUCT LVC 12 LTR MEDI- (MISCELLANEOUS) ×2 IMPLANT
CATH FOLEY 2WAY SLVR  5CC 16FR (CATHETERS)
CATH FOLEY 2WAY SLVR 5CC 16FR (CATHETERS) IMPLANT
CATH ROBINSON RED A/P 16FR (CATHETERS) ×2 IMPLANT
CLOTH BEACON ORANGE TIMEOUT ST (SAFETY) ×4 IMPLANT
DRAPE CAMERA CLOSED 9X96 (DRAPES) ×4 IMPLANT
ELECT REM PT RETURN 9FT ADLT (ELECTROSURGICAL)
ELECTRODE REM PT RTRN 9FT ADLT (ELECTROSURGICAL) ×2 IMPLANT
GLOVE BIOGEL M 6.5 STRL (GLOVE) ×2 IMPLANT
GLOVE BIOGEL PI IND STRL 6.5 (GLOVE) IMPLANT
GLOVE BIOGEL PI INDICATOR 6.5 (GLOVE) ×2
GLOVE SURG SS PI 8.0 STRL IVOR (GLOVE) ×4 IMPLANT
GOWN PREVENTION PLUS LG XLONG (DISPOSABLE) ×2 IMPLANT
GOWN STRL REIN XL XLG (GOWN DISPOSABLE) ×2 IMPLANT
GOWN STRL REUS W/TWL LRG LVL3 (GOWN DISPOSABLE) ×2 IMPLANT
GOWN STRL REUS W/TWL XL LVL3 (GOWN DISPOSABLE) ×2 IMPLANT
GUIDEWIRE STR DUAL SENSOR (WIRE) IMPLANT
NDL SAFETY ECLIPSE 18X1.5 (NEEDLE) ×2 IMPLANT
NEEDLE HYPO 18GX1.5 SHARP (NEEDLE) ×4
NEEDLE HYPO 22GX1.5 SAFETY (NEEDLE) IMPLANT
NS IRRIG 500ML POUR BTL (IV SOLUTION) IMPLANT
PACK CYSTOSCOPY (CUSTOM PROCEDURE TRAY) ×4 IMPLANT
SYR 20CC LL (SYRINGE) IMPLANT
SYR 30ML LL (SYRINGE) ×4 IMPLANT
WATER STERILE IRR 3000ML UROMA (IV SOLUTION) ×4 IMPLANT

## 2014-01-15 NOTE — Brief Op Note (Signed)
01/15/2014  10:20 AM  PATIENT:  Hannah Moreno  38 y.o. female  PRE-OPERATIVE DIAGNOSIS:  PAINFUL BLADDER/DISFUNCTIONAL VOIDING  POST-OPERATIVE DIAGNOSIS:  BLADDER/DISFUNCTIONAL VOIDING  PROCEDURE:  Procedure(s): CYSTOSCOPY WITH URETHRAL DILATATION (N/A) HYDRODISTENSION INSTALLATION OF PYRIDIUM AND MARCAINE    (N/A)  SURGEON:  Surgeon(s) and Role:    * Irine Seal, MD - Primary  PHYSICIAN ASSISTANT:   ASSISTANTS: none   ANESTHESIA:   general  EBL:  Total I/O In: 200 [I.V.:200] Out: 650 [Urine:650]  BLOOD ADMINISTERED:none  DRAINS: none   LOCAL MEDICATIONS USED:  MARCAINE     SPECIMEN:  No Specimen  DISPOSITION OF SPECIMEN:  N/A  COUNTS:  YES  TOURNIQUET:  * No tourniquets in log *  DICTATION: .Other Dictation: Dictation Number [id number]  PLAN OF CARE: Discharge to home after PACU  PATIENT DISPOSITION:  PACU - hemodynamically stable.   Delay start of Pharmacological VTE agent (>24hrs) due to surgical blood loss or risk of bleeding: not applicable

## 2014-01-15 NOTE — Anesthesia Postprocedure Evaluation (Signed)
  Anesthesia Post-op Note  Patient: Hannah Moreno  Procedure(s) Performed: Procedure(s) (LRB): CYSTOSCOPY WITH URETHRAL DILATATION (N/A) HYDRODISTENSION INSTALLATION OF PYRIDIUM AND MARCAINE    (N/A)  Patient Location: PACU  Anesthesia Type: General  Level of Consciousness: awake and alert   Airway and Oxygen Therapy: Patient Spontanous Breathing  Post-op Pain: mild  Post-op Assessment: Post-op Vital signs reviewed, Patient's Cardiovascular Status Stable, Respiratory Function Stable, Patent Airway and No signs of Nausea or vomiting  Last Vitals:  Filed Vitals:   01/15/14 1045  BP: 118/88  Pulse: 72  Temp:   Resp: 10    Post-op Vital Signs: stable   Complications: No apparent anesthesia complications

## 2014-01-15 NOTE — Discharge Instructions (Signed)
Cystoscopy, Care After °Refer to this sheet in the next few weeks. These instructions provide you with information on caring for yourself after your procedure. Your caregiver may also give you more specific instructions. Your treatment has been planned according to current medical practices, but problems sometimes occur. Call your caregiver if you have any problems or questions after your procedure. °HOME CARE INSTRUCTIONS  °Things you can do to ease any discomfort after your procedure include: °· Drinking enough water and fluids to keep your urine clear or pale yellow. °· Taking a warm bath to relieve any burning feelings. °SEEK IMMEDIATE MEDICAL CARE IF:  °· You have an increase in blood in your urine. °· You notice blood clots in your urine. °· You have difficulty passing urine. °· You have the chills. °· You have abdominal pain. °· You have a fever or persistent symptoms for more than 2 3 days. °· You have a fever and your symptoms suddenly get worse. °MAKE SURE YOU:  °· Understand these instructions. °· Will watch your condition. °· Will get help right away if you are not doing well or get worse. °Document Released: 04/28/2005 Document Revised: 06/11/2013 Document Reviewed: 04/01/2012 °ExitCare® Patient Information ©2014 [employer]. ° °Post Anesthesia Home Care Instructions ° °Activity: °Get plenty of rest for the remainder of the day. A responsible adult should stay with you for 24 hours following the procedure.  °For the next 24 hours, DO NOT: °-Drive a car °-Operate machinery °-Drink alcoholic beverages °-Take any medication unless instructed by your physician °-Make any legal decisions or sign important papers. ° °Meals: °Start with liquid foods such as gelatin or soup. Progress to regular foods as tolerated. Avoid greasy, spicy, heavy foods. If nausea and/or vomiting occur, drink only clear liquids until the nausea and/or vomiting subsides. Call your physician if vomiting continues. ° °Special  Instructions/Symptoms: °Your throat may feel dry or sore from the anesthesia or the breathing tube placed in your throat during surgery. If this causes discomfort, gargle with warm salt water. The discomfort should disappear within 24 hours. ° °

## 2014-01-15 NOTE — Transfer of Care (Signed)
Immediate Anesthesia Transfer of Care Note  Patient: Hannah Moreno  Procedure(s) Performed: Procedure(s) (LRB): CYSTOSCOPY WITH URETHRAL DILATATION (N/A) HYDRODISTENSION INSTALLATION OF PYRIDIUM AND MARCAINE    (N/A)  Patient Location: PACU  Anesthesia Type: General  Level of Consciousness: awake, sedated, patient cooperative and responds to stimulation  Airway & Oxygen Therapy: Patient Spontanous Breathing and Patient connected to face mask oxygen  Post-op Assessment: Report given to PACU RN, Post -op Vital signs reviewed and stable and Patient moving all extremities  Post vital signs: Reviewed and stable  Complications: No apparent anesthesia complications

## 2014-01-15 NOTE — Anesthesia Procedure Notes (Signed)
Procedure Name: LMA Insertion Date/Time: 01/15/2014 10:05 AM Performed by: Justice Rocher Pre-anesthesia Checklist: Patient identified, Emergency Drugs available, Suction available and Patient being monitored Patient Re-evaluated:Patient Re-evaluated prior to inductionOxygen Delivery Method: Circle System Utilized Preoxygenation: Pre-oxygenation with 100% oxygen Intubation Type: IV induction Ventilation: Mask ventilation without difficulty LMA: LMA inserted LMA Size: 4.0 Number of attempts: 1 Airway Equipment and Method: bite block Placement Confirmation: positive ETCO2 Tube secured with: Tape Dental Injury: Teeth and Oropharynx as per pre-operative assessment

## 2014-01-15 NOTE — Anesthesia Preprocedure Evaluation (Addendum)
Anesthesia Evaluation  Patient identified by MRN, date of birth, ID band Patient awake    Reviewed: Allergy & Precautions, H&P , NPO status , Patient's Chart, lab work & pertinent test results  Airway Mallampati: II TM Distance: >3 FB Neck ROM: Full    Dental no notable dental hx.    Pulmonary asthma ,  breath sounds clear to auscultation  Pulmonary exam normal       Cardiovascular negative cardio ROS  Rhythm:Regular Rate:Normal     Neuro/Psych negative neurological ROS  negative psych ROS   GI/Hepatic negative GI ROS, Neg liver ROS,   Endo/Other  negative endocrine ROS  Renal/GU negative Renal ROS  negative genitourinary   Musculoskeletal negative musculoskeletal ROS (+)   Abdominal   Peds negative pediatric ROS (+)  Hematology negative hematology ROS (+)   Anesthesia Other Findings multiiple pierciings, B ears, umbilicus, tongue. Pt. Claims none are removable. Informed of risk, albeit low for this procedure, and insertion of breathing device could disrupt tongue piercing.  Reproductive/Obstetrics negative OB ROS                          Anesthesia Physical Anesthesia Plan  ASA: II  Anesthesia Plan: General   Post-op Pain Management:    Induction: Intravenous  Airway Management Planned: LMA  Additional Equipment:   Intra-op Plan:   Post-operative Plan:   Informed Consent: I have reviewed the patients History and Physical, chart, labs and discussed the procedure including the risks, benefits and alternatives for the proposed anesthesia with the patient or authorized representative who has indicated his/her understanding and acceptance.   Dental advisory given  Plan Discussed with: CRNA and Surgeon  Anesthesia Plan Comments:         Anesthesia Quick Evaluation

## 2014-01-15 NOTE — Progress Notes (Signed)
Dr. Jeffie Pollock paged and called back, reported unable to void, bladder scan 444 ml. Inserted #16 using sterile technique with immediate return of 700 ml orange urine with no problems.

## 2014-01-15 NOTE — Interval H&P Note (Signed)
History and Physical Interval Note:  01/15/2014 9:11 AM  Hannah Moreno  has presented today for surgery, with the diagnosis of PAINFUL BLADDER/DISFUNCTIONAL VOIDING  The various methods of treatment have been discussed with the patient and family. After consideration of risks, benefits and other options for treatment, the patient has consented to  Procedure(s): CYSTOSCOPY WITH URETHRAL DILATATION (N/A) HYDRODISTENSION INSTALLATION OF PYRIDIUM AND MARCAINE    (N/A) as a surgical intervention .  The patient's history has been reviewed, patient examined, no change in status, stable for surgery.  I have reviewed the patient's chart and labs.  Questions were answered to the patient's satisfaction.     Hannah Moreno

## 2014-01-16 ENCOUNTER — Encounter (HOSPITAL_BASED_OUTPATIENT_CLINIC_OR_DEPARTMENT_OTHER): Payer: Self-pay | Admitting: Urology

## 2014-01-16 NOTE — Op Note (Signed)
NAMEDARLEN, GLEDHILL NO.:  [id number]  MEDICAL RECORD NO.:  [id number]  LOCATION:                                 FACILITY:  PHYSICIAN:  Marshall Cork. Jeffie Pollock, M.D.    DATE OF BIRTH:  12-15-1975  DATE OF PROCEDURE:  01/15/2014 DATE OF DISCHARGE:                              OPERATIVE REPORT   PROCEDURE: 1. Cystoscopy with hydrodistention of the bladder. 2. Urethral dilation. 3. Instillation of Pyridium and Marcaine.  PREOPERATIVE DIAGNOSIS:  Painful bladder with dysfunctional voiding.  POSTOPERATIVE DIAGNOSIS:  Painful bladder with dysfunctional voiding with interstitial cystitis.  SURGEON:  Marshall Cork. Jeffie Pollock, M.D.  ANESTHESIA:  General.  SPECIMEN:  None.  DRAINS:  None.  COMPLICATIONS:  None.  INDICATIONS:  Ms. Lahmann is a 38 year old white female with a history of painful bladder and probable dysfunctional voiding.  Her capacity at the time of urodynamics was approximately 200 mL with a stable bladder.  FINDINGS AND PROCEDURE:  She was given Cipro and taken to the operating room where general anesthetic was induced.  She was placed in lithotomy position and fitted with PAS hose.  Her perineum and genitalia were prepped with Betadine solution, and she was draped in usual sterile fashion.  Cystoscopy was performed using a 22-French scope and 12-degree lens. Examination revealed a normal urethra.  The bladder wall was smooth and pale without tumor, stones, or inflammation.  Ureteral orifices were unremarkable.  After initial cystoscopy, the bladder was filled under 80 cm water pressure to capacity.  This was held for 3 minutes, and the bladder was drained.  Her capacity under anesthesia was only 650 mL, and the terminal efflux was bloody.  Inspection following hydrodistention revealed diffuse glomerulations, but no bladder wall cracks.  The findings were consistent with interstitial cystitis.  The cystoscope was removed and the urethra was calibrated  from 24 to 36- [place] with female sounds.  She was tight at about 26-French.  The bladder was drained with the 32-French sound, and the bladder was then instilled with 30 mL of 0.25% Marcaine with 400 mg of crushed Pyridium, which was left indwelling in the bladder.  The patient then taken down from lithotomy position.  Her anesthetic was reversed.  She was moved to recovery room in stable condition.  There were no complications.     Marshall Cork. Jeffie Pollock, M.D.     JJW/MEDQ  D:  01/15/2014  T:  01/16/2014  Job:  [id number]

## 2014-02-17 ENCOUNTER — Ambulatory Visit: Payer: Medicaid Other | Admitting: Physical Therapy

## 2014-09-21 ENCOUNTER — Emergency Department (HOSPITAL_COMMUNITY): Payer: Medicaid Other

## 2014-09-21 ENCOUNTER — Emergency Department (HOSPITAL_COMMUNITY)
Admission: EM | Admit: 2014-09-21 | Discharge: 2014-09-22 | Disposition: A | Discharge status: Home / Self Care | Payer: Medicaid Other | Attending: Emergency Medicine | Admitting: Emergency Medicine

## 2014-09-21 ENCOUNTER — Encounter (HOSPITAL_COMMUNITY): Payer: Self-pay

## 2014-09-21 DIAGNOSIS — Z8541 Personal history of malignant neoplasm of cervix uteri: Secondary | ICD-10-CM | POA: Diagnosis not present

## 2014-09-21 DIAGNOSIS — Z87828 Personal history of other (healed) physical injury and trauma: Secondary | ICD-10-CM | POA: Insufficient documentation

## 2014-09-21 DIAGNOSIS — N3090 Cystitis, unspecified without hematuria: Secondary | ICD-10-CM | POA: Diagnosis present

## 2014-09-21 DIAGNOSIS — R339 Retention of urine, unspecified: Secondary | ICD-10-CM | POA: Diagnosis not present

## 2014-09-21 DIAGNOSIS — G43909 Migraine, unspecified, not intractable, without status migrainosus: Secondary | ICD-10-CM | POA: Diagnosis not present

## 2014-09-21 DIAGNOSIS — Z79899 Other long term (current) drug therapy: Secondary | ICD-10-CM | POA: Insufficient documentation

## 2014-09-21 DIAGNOSIS — M1990 Unspecified osteoarthritis, unspecified site: Secondary | ICD-10-CM | POA: Diagnosis not present

## 2014-09-21 DIAGNOSIS — F909 Attention-deficit hyperactivity disorder, unspecified type: Secondary | ICD-10-CM | POA: Diagnosis not present

## 2014-09-21 DIAGNOSIS — J45909 Unspecified asthma, uncomplicated: Secondary | ICD-10-CM | POA: Insufficient documentation

## 2014-09-21 LAB — CBC WITH DIFFERENTIAL/PLATELET
BASOS ABS: 0 10*3/uL (ref 0.0–0.1)
Basophils Relative: 1 % (ref 0–1)
Eosinophils Absolute: 0.2 10*3/uL (ref 0.0–0.7)
Eosinophils Relative: 3 % (ref 0–5)
HCT: 38.1 % (ref 36.0–46.0)
HEMOGLOBIN: 12.2 g/dL (ref 12.0–15.0)
LUC, ABSOLUTE: 0 10*3/uL (ref 0.0–0.5)
LUCs, %: 0 % (ref 0–4)
LYMPHS PCT: 25 % (ref 12–46)
Lymphs Abs: 1.7 10*3/uL (ref 0.7–4.0)
MCH: 30.8 pg (ref 26.0–34.0)
MCHC: 32 g/dL (ref 30.0–36.0)
MCV: 96.2 fL (ref 78.0–100.0)
MONOS PCT: 8 % (ref 3–12)
Monocytes Absolute: 0.5 10*3/uL (ref 0.1–1.0)
NEUTROS ABS: 4.2 10*3/uL (ref 1.7–7.7)
Neutrophils Relative %: 63 % (ref 43–77)
Platelets: 226 10*3/uL (ref 150–400)
RBC: 3.96 MIL/uL (ref 3.87–5.11)
RDW: 12.4 % (ref 11.5–15.5)
WBC: 6.7 10*3/uL (ref 4.0–10.5)

## 2014-09-21 LAB — BASIC METABOLIC PANEL
ANION GAP: 12 (ref 5–15)
BUN: 19 mg/dL (ref 6–23)
CO2: 24 mEq/L (ref 19–32)
Calcium: 8.9 mg/dL (ref 8.4–10.5)
Chloride: 104 mEq/L (ref 96–112)
Creatinine, Ser: 0.89 mg/dL (ref 0.50–1.10)
GFR calc Af Amer: >90 mL/min (ref >90)
GFR, EST NON AFRICAN AMERICAN: 82 mL/min — AB (ref >90)
Glucose, Bld: 100 mg/dL — ABNORMAL HIGH (ref 70–99)
Potassium: 3.4 mEq/L — ABNORMAL LOW (ref 3.7–5.3)
SODIUM: 140 meq/L (ref 137–147)

## 2014-09-21 LAB — URINALYSIS, ROUTINE W REFLEX MICROSCOPIC
Glucose, UA: NEGATIVE mg/dL
HGB URINE DIPSTICK: NEGATIVE
Ketones, ur: 15 mg/dL — AB
Nitrite: POSITIVE — AB
Protein, ur: NEGATIVE mg/dL
Specific Gravity, Urine: 1.017 (ref 1.005–1.030)
UROBILINOGEN UA: 2 mg/dL — AB (ref 0.0–1.0)
pH: 5 (ref 5.0–8.0)

## 2014-09-21 LAB — URINE MICROSCOPIC-ADD ON

## 2014-09-21 LAB — I-STAT BETA HCG BLOOD, ED (MC, WL, AP ONLY): I-stat hCG, quantitative: <5 m[IU]/mL (ref <5)

## 2014-09-21 MED ORDER — HYDROMORPHONE HCL 1 MG/ML IJ SOLN
0.5000 mg | INTRAMUSCULAR | Status: DC | PRN
  Administered 2014-09-21: 0.5 mg via INTRAVENOUS
  Filled 2014-09-21: qty 1

## 2014-09-21 MED ORDER — SODIUM CHLORIDE 0.9 % IV SOLN
1000.0000 mL | INTRAVENOUS | Status: DC
  Administered 2014-09-21: 1000 mL via INTRAVENOUS

## 2014-09-21 MED ORDER — CEPHALEXIN 500 MG PO CAPS: 500.0000 mg | ORAL_CAPSULE | Freq: Four times a day (QID) | ORAL | Status: DC

## 2014-09-21 MED ORDER — ONDANSETRON HCL 4 MG/2ML IJ SOLN
4.0000 mg | Freq: Once | INTRAMUSCULAR | Status: AC
  Administered 2014-09-21: 4 mg via INTRAVENOUS
  Filled 2014-09-21: qty 2

## 2014-09-21 MED ORDER — SODIUM CHLORIDE 0.9 % IV SOLN
1000.0000 mL | Freq: Once | INTRAVENOUS | Status: AC
  Administered 2014-09-21: 1000 mL via INTRAVENOUS

## 2014-09-21 NOTE — ED Notes (Signed)
Pt is eating and drinking without any problem.  Pt's mother at bedside.

## 2014-09-21 NOTE — ED Notes (Signed)
Pt has hx of kidney stones.  Notified Dr. Jeffie Pollock office today that she was passing stones.  Pt was told to come here for continued bladder pain.
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
## 2014-09-21 NOTE — Discharge Instructions (Signed)
Acute Urinary Retention °Acute urinary retention is the temporary inability to urinate. This is an uncommon problem in women. It can be caused by: °· Infection. °· A side effect of a medicine. °· A problem in a nearby organ that presses or squeezes on the bladder or the urethra (the tube that drains the bladder). °· Psychological problems. °·  Surgery on your bladder, urethra, or pelvic organs that causes obstruction to the outflow of urine from your bladder. °HOME CARE INSTRUCTIONS  °If you are sent home with a Foley catheter and a drainage system, you will need to discuss the best course of action with your health care provider. While the catheter is in, maintain a good intake of fluids. Keep the drainage bag emptied and lower than your catheter. This is so that contaminated urine will not flow back into your bladder, which could lead to a urinary tract infection. °There are two main types of drainage bags. One is a large bag that usually is used at night. It has a good capacity that will allow you to sleep through the night without having to empty it. The second type is called a leg bag. It has a smaller capacity so it needs to be emptied more frequently. However, the main advantage is that it can be attached by a leg strap and goes underneath your clothing, allowing you the freedom to move about or leave your home. °Only take over-the-counter or prescription medicines for pain, discomfort, or fever as directed by your health care provider.  °SEEK MEDICAL CARE IF: °· You develop a low-grade fever. °· You experience spasms or leakage of urine with the spasms. °SEEK IMMEDIATE MEDICAL CARE IF:  °· You develop chills or fever. °· Your catheter stops draining urine. °· Your catheter falls out. °· You start to develop increased bleeding that does not respond to rest and increased fluid intake. °MAKE SURE YOU: °· Understand these instructions. °· Will watch your condition. °· Will get help right away if you are not  doing well or get worse. °Document Released: 10/08/2006 Document Revised: 07/30/2013 Document Reviewed: 03/20/2013 °ExitCare® Patient Information ©2015 [employer]. This information is not intended to replace advice given to you by your health care provider. Make sure you discuss any questions you have with your health care provider. ° °

## 2014-09-23 LAB — URINE CULTURE
Colony Count: NO GROWTH
Culture: NO GROWTH

## 2015-06-08 DIAGNOSIS — M5417 Radiculopathy, lumbosacral region: Secondary | ICD-10-CM | POA: Diagnosis not present

## 2015-06-08 DIAGNOSIS — M4317 Spondylolisthesis, lumbosacral region: Secondary | ICD-10-CM | POA: Diagnosis not present

## 2015-07-05 DIAGNOSIS — M5417 Radiculopathy, lumbosacral region: Secondary | ICD-10-CM | POA: Diagnosis not present

## 2015-07-05 DIAGNOSIS — M4317 Spondylolisthesis, lumbosacral region: Secondary | ICD-10-CM | POA: Diagnosis not present

## 2015-09-18 DIAGNOSIS — H40033 Anatomical narrow angle, bilateral: Secondary | ICD-10-CM | POA: Diagnosis not present

## 2015-09-18 DIAGNOSIS — H04123 Dry eye syndrome of bilateral lacrimal glands: Secondary | ICD-10-CM | POA: Diagnosis not present

## 2015-09-22 DIAGNOSIS — M179 Osteoarthritis of knee, unspecified: Secondary | ICD-10-CM | POA: Diagnosis not present

## 2015-09-22 DIAGNOSIS — M79642 Pain in left hand: Secondary | ICD-10-CM | POA: Diagnosis not present

## 2015-11-30 DIAGNOSIS — Z6820 Body mass index (BMI) 20.0-20.9, adult: Secondary | ICD-10-CM | POA: Diagnosis not present

## 2015-11-30 DIAGNOSIS — R1110 Vomiting, unspecified: Secondary | ICD-10-CM | POA: Diagnosis not present

## 2015-11-30 DIAGNOSIS — M545 Low back pain: Secondary | ICD-10-CM | POA: Diagnosis not present

## 2015-11-30 DIAGNOSIS — R1012 Left upper quadrant pain: Secondary | ICD-10-CM | POA: Diagnosis not present

## 2015-11-30 DIAGNOSIS — G8929 Other chronic pain: Secondary | ICD-10-CM | POA: Diagnosis not present

## 2015-12-07 DIAGNOSIS — R1012 Left upper quadrant pain: Secondary | ICD-10-CM | POA: Diagnosis not present

## 2015-12-07 DIAGNOSIS — Z6820 Body mass index (BMI) 20.0-20.9, adult: Secondary | ICD-10-CM | POA: Diagnosis not present

## 2015-12-20 DIAGNOSIS — R634 Abnormal weight loss: Secondary | ICD-10-CM | POA: Diagnosis not present

## 2015-12-20 DIAGNOSIS — D069 Carcinoma in situ of cervix, unspecified: Secondary | ICD-10-CM | POA: Diagnosis not present

## 2015-12-20 DIAGNOSIS — R102 Pelvic and perineal pain: Secondary | ICD-10-CM | POA: Diagnosis not present

## 2015-12-24 ENCOUNTER — Other Ambulatory Visit: Payer: Self-pay

## 2015-12-24 DIAGNOSIS — G629 Polyneuropathy, unspecified: Secondary | ICD-10-CM | POA: Diagnosis not present

## 2015-12-24 DIAGNOSIS — G43909 Migraine, unspecified, not intractable, without status migrainosus: Secondary | ICD-10-CM | POA: Diagnosis not present

## 2015-12-24 DIAGNOSIS — D067 Carcinoma in situ of other parts of cervix: Secondary | ICD-10-CM | POA: Diagnosis not present

## 2015-12-24 DIAGNOSIS — J45909 Unspecified asthma, uncomplicated: Secondary | ICD-10-CM | POA: Diagnosis not present

## 2015-12-24 DIAGNOSIS — K219 Gastro-esophageal reflux disease without esophagitis: Secondary | ICD-10-CM | POA: Diagnosis not present

## 2015-12-24 DIAGNOSIS — Z79899 Other long term (current) drug therapy: Secondary | ICD-10-CM | POA: Diagnosis not present

## 2015-12-24 DIAGNOSIS — F419 Anxiety disorder, unspecified: Secondary | ICD-10-CM | POA: Diagnosis not present

## 2015-12-24 DIAGNOSIS — D069 Carcinoma in situ of cervix, unspecified: Secondary | ICD-10-CM | POA: Diagnosis not present

## 2016-01-21 DIAGNOSIS — H6692 Otitis media, unspecified, left ear: Secondary | ICD-10-CM | POA: Diagnosis not present

## 2016-01-21 DIAGNOSIS — R509 Fever, unspecified: Secondary | ICD-10-CM | POA: Diagnosis not present

## 2016-01-21 DIAGNOSIS — F419 Anxiety disorder, unspecified: Secondary | ICD-10-CM | POA: Diagnosis not present

## 2016-01-21 DIAGNOSIS — R41840 Attention and concentration deficit: Secondary | ICD-10-CM | POA: Diagnosis not present

## 2016-02-22 DIAGNOSIS — G8929 Other chronic pain: Secondary | ICD-10-CM | POA: Diagnosis not present

## 2016-02-22 DIAGNOSIS — M545 Low back pain: Secondary | ICD-10-CM | POA: Diagnosis not present

## 2016-02-22 DIAGNOSIS — R41840 Attention and concentration deficit: Secondary | ICD-10-CM | POA: Diagnosis not present

## 2016-02-22 DIAGNOSIS — G43909 Migraine, unspecified, not intractable, without status migrainosus: Secondary | ICD-10-CM | POA: Diagnosis not present

## 2016-03-07 DIAGNOSIS — R32 Unspecified urinary incontinence: Secondary | ICD-10-CM | POA: Diagnosis not present

## 2016-03-07 DIAGNOSIS — R509 Fever, unspecified: Secondary | ICD-10-CM | POA: Diagnosis not present

## 2016-03-07 DIAGNOSIS — G43909 Migraine, unspecified, not intractable, without status migrainosus: Secondary | ICD-10-CM | POA: Diagnosis not present

## 2016-03-07 DIAGNOSIS — J309 Allergic rhinitis, unspecified: Secondary | ICD-10-CM | POA: Diagnosis not present

## 2016-03-27 DIAGNOSIS — G8929 Other chronic pain: Secondary | ICD-10-CM | POA: Diagnosis not present

## 2016-03-27 DIAGNOSIS — M545 Low back pain: Secondary | ICD-10-CM | POA: Diagnosis not present

## 2016-03-27 DIAGNOSIS — J309 Allergic rhinitis, unspecified: Secondary | ICD-10-CM | POA: Diagnosis not present

## 2016-03-27 DIAGNOSIS — K649 Unspecified hemorrhoids: Secondary | ICD-10-CM | POA: Diagnosis not present

## 2016-04-27 DIAGNOSIS — K625 Hemorrhage of anus and rectum: Secondary | ICD-10-CM | POA: Diagnosis not present

## 2016-04-27 DIAGNOSIS — Z800 Family history of malignant neoplasm of digestive organs: Secondary | ICD-10-CM | POA: Diagnosis not present

## 2016-04-27 DIAGNOSIS — K5904 Chronic idiopathic constipation: Secondary | ICD-10-CM | POA: Diagnosis not present

## 2016-05-26 DIAGNOSIS — K625 Hemorrhage of anus and rectum: Secondary | ICD-10-CM | POA: Diagnosis not present

## 2016-05-26 DIAGNOSIS — Z800 Family history of malignant neoplasm of digestive organs: Secondary | ICD-10-CM | POA: Diagnosis not present

## 2016-05-26 DIAGNOSIS — K602 Anal fissure, unspecified: Secondary | ICD-10-CM | POA: Diagnosis not present

## 2016-05-26 DIAGNOSIS — K5904 Chronic idiopathic constipation: Secondary | ICD-10-CM | POA: Diagnosis not present

## 2016-05-26 DIAGNOSIS — K600 Acute anal fissure: Secondary | ICD-10-CM | POA: Diagnosis not present

## 2016-05-26 DIAGNOSIS — K5903 Drug induced constipation: Secondary | ICD-10-CM | POA: Diagnosis not present

## 2016-05-26 DIAGNOSIS — K640 First degree hemorrhoids: Secondary | ICD-10-CM | POA: Diagnosis not present

## 2016-06-21 DIAGNOSIS — K6289 Other specified diseases of anus and rectum: Secondary | ICD-10-CM | POA: Diagnosis not present

## 2016-06-21 DIAGNOSIS — K649 Unspecified hemorrhoids: Secondary | ICD-10-CM | POA: Diagnosis not present

## 2016-06-21 DIAGNOSIS — K602 Anal fissure, unspecified: Secondary | ICD-10-CM | POA: Diagnosis not present

## 2016-06-29 DIAGNOSIS — R32 Unspecified urinary incontinence: Secondary | ICD-10-CM | POA: Diagnosis not present

## 2016-06-29 DIAGNOSIS — G8929 Other chronic pain: Secondary | ICD-10-CM | POA: Diagnosis not present

## 2016-06-29 DIAGNOSIS — R41840 Attention and concentration deficit: Secondary | ICD-10-CM | POA: Diagnosis not present

## 2016-06-29 DIAGNOSIS — M545 Low back pain: Secondary | ICD-10-CM | POA: Diagnosis not present

## 2016-07-25 DIAGNOSIS — M545 Low back pain: Secondary | ICD-10-CM | POA: Diagnosis not present

## 2016-07-25 DIAGNOSIS — G8929 Other chronic pain: Secondary | ICD-10-CM | POA: Diagnosis not present

## 2016-07-25 DIAGNOSIS — H6692 Otitis media, unspecified, left ear: Secondary | ICD-10-CM | POA: Diagnosis not present

## 2016-07-25 DIAGNOSIS — R41840 Attention and concentration deficit: Secondary | ICD-10-CM | POA: Diagnosis not present

## 2016-07-26 DIAGNOSIS — Z13220 Encounter for screening for lipoid disorders: Secondary | ICD-10-CM | POA: Diagnosis not present

## 2016-07-26 DIAGNOSIS — E538 Deficiency of other specified B group vitamins: Secondary | ICD-10-CM | POA: Diagnosis not present

## 2016-08-24 DIAGNOSIS — M545 Low back pain: Secondary | ICD-10-CM | POA: Diagnosis not present

## 2016-08-24 DIAGNOSIS — Z79899 Other long term (current) drug therapy: Secondary | ICD-10-CM | POA: Diagnosis not present

## 2016-08-24 DIAGNOSIS — R41840 Attention and concentration deficit: Secondary | ICD-10-CM | POA: Diagnosis not present

## 2016-08-24 DIAGNOSIS — G40909 Epilepsy, unspecified, not intractable, without status epilepticus: Secondary | ICD-10-CM | POA: Diagnosis not present

## 2016-08-24 DIAGNOSIS — G8929 Other chronic pain: Secondary | ICD-10-CM | POA: Diagnosis not present

## 2016-09-03 DIAGNOSIS — Z0389 Encounter for observation for other suspected diseases and conditions ruled out: Secondary | ICD-10-CM | POA: Diagnosis not present

## 2016-09-03 DIAGNOSIS — S60221A Contusion of right hand, initial encounter: Secondary | ICD-10-CM | POA: Diagnosis not present

## 2016-09-21 DIAGNOSIS — R41840 Attention and concentration deficit: Secondary | ICD-10-CM | POA: Diagnosis not present

## 2016-09-21 DIAGNOSIS — F419 Anxiety disorder, unspecified: Secondary | ICD-10-CM | POA: Diagnosis not present

## 2016-09-21 DIAGNOSIS — G8929 Other chronic pain: Secondary | ICD-10-CM | POA: Diagnosis not present

## 2016-09-21 DIAGNOSIS — M545 Low back pain: Secondary | ICD-10-CM | POA: Diagnosis not present

## 2016-10-19 DIAGNOSIS — R41840 Attention and concentration deficit: Secondary | ICD-10-CM | POA: Diagnosis not present

## 2016-10-19 DIAGNOSIS — M545 Low back pain: Secondary | ICD-10-CM | POA: Diagnosis not present

## 2016-10-19 DIAGNOSIS — F419 Anxiety disorder, unspecified: Secondary | ICD-10-CM | POA: Diagnosis not present

## 2016-10-19 DIAGNOSIS — G8929 Other chronic pain: Secondary | ICD-10-CM | POA: Diagnosis not present

## 2016-10-20 DIAGNOSIS — M79644 Pain in right finger(s): Secondary | ICD-10-CM | POA: Diagnosis not present

## 2016-11-01 DIAGNOSIS — R1011 Right upper quadrant pain: Secondary | ICD-10-CM | POA: Diagnosis not present

## 2016-11-01 DIAGNOSIS — R110 Nausea: Secondary | ICD-10-CM | POA: Diagnosis not present

## 2016-11-01 DIAGNOSIS — K8010 Calculus of gallbladder with chronic cholecystitis without obstruction: Secondary | ICD-10-CM | POA: Diagnosis not present

## 2016-11-03 DIAGNOSIS — M79644 Pain in right finger(s): Secondary | ICD-10-CM | POA: Diagnosis not present

## 2016-11-21 DIAGNOSIS — R509 Fever, unspecified: Secondary | ICD-10-CM | POA: Diagnosis not present

## 2016-11-21 DIAGNOSIS — M545 Low back pain: Secondary | ICD-10-CM | POA: Diagnosis not present

## 2016-11-21 DIAGNOSIS — H6592 Unspecified nonsuppurative otitis media, left ear: Secondary | ICD-10-CM | POA: Diagnosis not present

## 2016-11-21 DIAGNOSIS — Z79899 Other long term (current) drug therapy: Secondary | ICD-10-CM | POA: Diagnosis not present

## 2016-11-21 DIAGNOSIS — G8929 Other chronic pain: Secondary | ICD-10-CM | POA: Diagnosis not present

## 2016-11-23 HISTORY — PX: CHOLECYSTECTOMY: SHX55

## 2016-11-27 DIAGNOSIS — Z01818 Encounter for other preprocedural examination: Secondary | ICD-10-CM | POA: Diagnosis not present

## 2016-12-01 DIAGNOSIS — R569 Unspecified convulsions: Secondary | ICD-10-CM | POA: Diagnosis not present

## 2016-12-01 DIAGNOSIS — K8020 Calculus of gallbladder without cholecystitis without obstruction: Secondary | ICD-10-CM | POA: Diagnosis not present

## 2016-12-01 DIAGNOSIS — K8018 Calculus of gallbladder with other cholecystitis without obstruction: Secondary | ICD-10-CM | POA: Diagnosis not present

## 2016-12-01 DIAGNOSIS — J45909 Unspecified asthma, uncomplicated: Secondary | ICD-10-CM | POA: Diagnosis not present

## 2016-12-01 DIAGNOSIS — K219 Gastro-esophageal reflux disease without esophagitis: Secondary | ICD-10-CM | POA: Diagnosis not present

## 2016-12-01 DIAGNOSIS — Z79899 Other long term (current) drug therapy: Secondary | ICD-10-CM | POA: Diagnosis not present

## 2016-12-01 DIAGNOSIS — K8010 Calculus of gallbladder with chronic cholecystitis without obstruction: Secondary | ICD-10-CM | POA: Diagnosis not present

## 2016-12-01 DIAGNOSIS — N189 Chronic kidney disease, unspecified: Secondary | ICD-10-CM | POA: Diagnosis not present

## 2016-12-01 DIAGNOSIS — R109 Unspecified abdominal pain: Secondary | ICD-10-CM | POA: Diagnosis not present

## 2016-12-01 DIAGNOSIS — K8080 Other cholelithiasis without obstruction: Secondary | ICD-10-CM | POA: Diagnosis not present

## 2016-12-15 DIAGNOSIS — N390 Urinary tract infection, site not specified: Secondary | ICD-10-CM | POA: Diagnosis not present

## 2016-12-15 DIAGNOSIS — R8290 Unspecified abnormal findings in urine: Secondary | ICD-10-CM | POA: Diagnosis not present

## 2016-12-19 DIAGNOSIS — M545 Low back pain: Secondary | ICD-10-CM | POA: Diagnosis not present

## 2016-12-19 DIAGNOSIS — F419 Anxiety disorder, unspecified: Secondary | ICD-10-CM | POA: Diagnosis not present

## 2016-12-19 DIAGNOSIS — G8929 Other chronic pain: Secondary | ICD-10-CM | POA: Diagnosis not present

## 2016-12-19 DIAGNOSIS — N390 Urinary tract infection, site not specified: Secondary | ICD-10-CM | POA: Diagnosis not present

## 2017-03-14 ENCOUNTER — Other Ambulatory Visit: Payer: Self-pay | Admitting: Pain Medicine

## 2017-03-14 ENCOUNTER — Ambulatory Visit
Admission: RE | Admit: 2017-03-14 | Discharge: 2017-03-14 | Disposition: A | Discharge status: Home / Self Care | Payer: Medicare HMO | Source: Ambulatory Visit | Attending: Pain Medicine | Admitting: Pain Medicine

## 2017-03-14 DIAGNOSIS — M4316 Spondylolisthesis, lumbar region: Secondary | ICD-10-CM

## 2017-03-14 DIAGNOSIS — M545 Low back pain: Secondary | ICD-10-CM

## 2017-09-05 DIAGNOSIS — G894 Chronic pain syndrome: Secondary | ICD-10-CM | POA: Diagnosis not present

## 2017-09-05 DIAGNOSIS — M545 Low back pain: Secondary | ICD-10-CM | POA: Diagnosis not present

## 2017-09-05 DIAGNOSIS — M25569 Pain in unspecified knee: Secondary | ICD-10-CM | POA: Diagnosis not present

## 2017-09-05 DIAGNOSIS — M47816 Spondylosis without myelopathy or radiculopathy, lumbar region: Secondary | ICD-10-CM | POA: Diagnosis not present

## 2017-10-10 DIAGNOSIS — Z79899 Other long term (current) drug therapy: Secondary | ICD-10-CM | POA: Diagnosis not present

## 2017-10-10 DIAGNOSIS — M228X2 Other disorders of patella, left knee: Secondary | ICD-10-CM | POA: Diagnosis not present

## 2017-10-10 DIAGNOSIS — G894 Chronic pain syndrome: Secondary | ICD-10-CM | POA: Diagnosis not present

## 2017-10-10 DIAGNOSIS — M1732 Unilateral post-traumatic osteoarthritis, left knee: Secondary | ICD-10-CM | POA: Diagnosis not present

## 2017-10-10 DIAGNOSIS — M545 Low back pain: Secondary | ICD-10-CM | POA: Diagnosis not present

## 2017-10-10 DIAGNOSIS — Z79891 Long term (current) use of opiate analgesic: Secondary | ICD-10-CM | POA: Diagnosis not present

## 2017-10-10 DIAGNOSIS — M47816 Spondylosis without myelopathy or radiculopathy, lumbar region: Secondary | ICD-10-CM | POA: Diagnosis not present

## 2017-10-29 DIAGNOSIS — M25562 Pain in left knee: Secondary | ICD-10-CM | POA: Diagnosis not present

## 2017-10-29 DIAGNOSIS — M25561 Pain in right knee: Secondary | ICD-10-CM | POA: Diagnosis not present

## 2017-10-29 DIAGNOSIS — M12569 Traumatic arthropathy, unspecified knee: Secondary | ICD-10-CM | POA: Diagnosis not present

## 2017-11-13 DIAGNOSIS — R32 Unspecified urinary incontinence: Secondary | ICD-10-CM | POA: Diagnosis not present

## 2017-11-13 DIAGNOSIS — J3089 Other allergic rhinitis: Secondary | ICD-10-CM | POA: Diagnosis not present

## 2017-11-13 DIAGNOSIS — Z6821 Body mass index (BMI) 21.0-21.9, adult: Secondary | ICD-10-CM | POA: Diagnosis not present

## 2017-11-13 DIAGNOSIS — K219 Gastro-esophageal reflux disease without esophagitis: Secondary | ICD-10-CM | POA: Diagnosis not present

## 2017-11-21 DIAGNOSIS — M47816 Spondylosis without myelopathy or radiculopathy, lumbar region: Secondary | ICD-10-CM | POA: Diagnosis not present

## 2017-11-21 DIAGNOSIS — Z79899 Other long term (current) drug therapy: Secondary | ICD-10-CM | POA: Diagnosis not present

## 2017-11-21 DIAGNOSIS — G894 Chronic pain syndrome: Secondary | ICD-10-CM | POA: Diagnosis not present

## 2017-11-21 DIAGNOSIS — M228X2 Other disorders of patella, left knee: Secondary | ICD-10-CM | POA: Diagnosis not present

## 2017-11-21 DIAGNOSIS — Z79891 Long term (current) use of opiate analgesic: Secondary | ICD-10-CM | POA: Diagnosis not present

## 2017-11-21 DIAGNOSIS — M1732 Unilateral post-traumatic osteoarthritis, left knee: Secondary | ICD-10-CM | POA: Diagnosis not present

## 2017-12-05 DIAGNOSIS — G894 Chronic pain syndrome: Secondary | ICD-10-CM | POA: Diagnosis not present

## 2017-12-05 DIAGNOSIS — M545 Low back pain: Secondary | ICD-10-CM | POA: Diagnosis not present

## 2017-12-05 DIAGNOSIS — M4306 Spondylolysis, lumbar region: Secondary | ICD-10-CM | POA: Diagnosis not present

## 2017-12-05 DIAGNOSIS — M47817 Spondylosis without myelopathy or radiculopathy, lumbosacral region: Secondary | ICD-10-CM | POA: Diagnosis not present

## 2017-12-19 DIAGNOSIS — M47816 Spondylosis without myelopathy or radiculopathy, lumbar region: Secondary | ICD-10-CM | POA: Diagnosis not present

## 2017-12-19 DIAGNOSIS — Z79899 Other long term (current) drug therapy: Secondary | ICD-10-CM | POA: Diagnosis not present

## 2017-12-19 DIAGNOSIS — G894 Chronic pain syndrome: Secondary | ICD-10-CM | POA: Diagnosis not present

## 2017-12-19 DIAGNOSIS — Z79891 Long term (current) use of opiate analgesic: Secondary | ICD-10-CM | POA: Diagnosis not present

## 2017-12-19 DIAGNOSIS — M228X2 Other disorders of patella, left knee: Secondary | ICD-10-CM | POA: Diagnosis not present

## 2017-12-19 DIAGNOSIS — M1732 Unilateral post-traumatic osteoarthritis, left knee: Secondary | ICD-10-CM | POA: Diagnosis not present

## 2018-01-15 DIAGNOSIS — G894 Chronic pain syndrome: Secondary | ICD-10-CM | POA: Diagnosis not present

## 2018-01-15 DIAGNOSIS — M545 Low back pain: Secondary | ICD-10-CM | POA: Diagnosis not present

## 2018-01-15 DIAGNOSIS — Z888 Allergy status to other drugs, medicaments and biological substances status: Secondary | ICD-10-CM | POA: Diagnosis not present

## 2018-01-15 DIAGNOSIS — M47817 Spondylosis without myelopathy or radiculopathy, lumbosacral region: Secondary | ICD-10-CM | POA: Diagnosis not present

## 2018-01-15 DIAGNOSIS — M4306 Spondylolysis, lumbar region: Secondary | ICD-10-CM | POA: Diagnosis not present

## 2018-01-15 DIAGNOSIS — Z885 Allergy status to narcotic agent status: Secondary | ICD-10-CM | POA: Diagnosis not present

## 2018-01-15 DIAGNOSIS — K219 Gastro-esophageal reflux disease without esophagitis: Secondary | ICD-10-CM | POA: Diagnosis not present

## 2018-01-15 DIAGNOSIS — Z91030 Bee allergy status: Secondary | ICD-10-CM | POA: Diagnosis not present

## 2018-01-15 DIAGNOSIS — Z882 Allergy status to sulfonamides status: Secondary | ICD-10-CM | POA: Diagnosis not present

## 2018-01-21 DIAGNOSIS — G894 Chronic pain syndrome: Secondary | ICD-10-CM | POA: Diagnosis not present

## 2018-01-21 DIAGNOSIS — M545 Low back pain: Secondary | ICD-10-CM | POA: Diagnosis not present

## 2018-01-21 DIAGNOSIS — M47817 Spondylosis without myelopathy or radiculopathy, lumbosacral region: Secondary | ICD-10-CM | POA: Diagnosis not present

## 2018-01-21 DIAGNOSIS — M4306 Spondylolysis, lumbar region: Secondary | ICD-10-CM | POA: Diagnosis not present

## 2018-01-28 DIAGNOSIS — M2391 Unspecified internal derangement of right knee: Secondary | ICD-10-CM | POA: Diagnosis not present

## 2018-01-28 DIAGNOSIS — M47817 Spondylosis without myelopathy or radiculopathy, lumbosacral region: Secondary | ICD-10-CM | POA: Diagnosis not present

## 2018-01-28 DIAGNOSIS — G894 Chronic pain syndrome: Secondary | ICD-10-CM | POA: Diagnosis not present

## 2018-01-28 DIAGNOSIS — M4306 Spondylolysis, lumbar region: Secondary | ICD-10-CM | POA: Diagnosis not present

## 2018-02-18 DIAGNOSIS — G894 Chronic pain syndrome: Secondary | ICD-10-CM | POA: Diagnosis not present

## 2018-02-18 DIAGNOSIS — M47817 Spondylosis without myelopathy or radiculopathy, lumbosacral region: Secondary | ICD-10-CM | POA: Diagnosis not present

## 2018-02-18 DIAGNOSIS — M79604 Pain in right leg: Secondary | ICD-10-CM | POA: Diagnosis not present

## 2018-02-18 DIAGNOSIS — M4306 Spondylolysis, lumbar region: Secondary | ICD-10-CM | POA: Diagnosis not present

## 2018-03-19 DIAGNOSIS — Z79891 Long term (current) use of opiate analgesic: Secondary | ICD-10-CM | POA: Diagnosis not present

## 2018-03-19 DIAGNOSIS — G894 Chronic pain syndrome: Secondary | ICD-10-CM | POA: Diagnosis not present

## 2018-03-19 DIAGNOSIS — M47816 Spondylosis without myelopathy or radiculopathy, lumbar region: Secondary | ICD-10-CM | POA: Diagnosis not present

## 2018-03-19 DIAGNOSIS — Z79899 Other long term (current) drug therapy: Secondary | ICD-10-CM | POA: Diagnosis not present

## 2018-03-19 DIAGNOSIS — M1732 Unilateral post-traumatic osteoarthritis, left knee: Secondary | ICD-10-CM | POA: Diagnosis not present

## 2018-03-19 DIAGNOSIS — M228X2 Other disorders of patella, left knee: Secondary | ICD-10-CM | POA: Diagnosis not present

## 2018-04-16 DIAGNOSIS — M1732 Unilateral post-traumatic osteoarthritis, left knee: Secondary | ICD-10-CM | POA: Diagnosis not present

## 2018-04-16 DIAGNOSIS — M25569 Pain in unspecified knee: Secondary | ICD-10-CM | POA: Diagnosis not present

## 2018-04-16 DIAGNOSIS — G894 Chronic pain syndrome: Secondary | ICD-10-CM | POA: Diagnosis not present

## 2018-04-16 DIAGNOSIS — Z79891 Long term (current) use of opiate analgesic: Secondary | ICD-10-CM | POA: Diagnosis not present

## 2018-04-16 DIAGNOSIS — Z79899 Other long term (current) drug therapy: Secondary | ICD-10-CM | POA: Diagnosis not present

## 2018-04-16 DIAGNOSIS — M47816 Spondylosis without myelopathy or radiculopathy, lumbar region: Secondary | ICD-10-CM | POA: Diagnosis not present

## 2018-05-16 ENCOUNTER — Ambulatory Visit
Admission: RE | Admit: 2018-05-16 | Discharge: 2018-05-16 | Disposition: A | Discharge status: Home / Self Care | Payer: Medicare Other | Source: Ambulatory Visit | Attending: Physician Assistant | Admitting: Physician Assistant

## 2018-05-16 ENCOUNTER — Other Ambulatory Visit: Payer: Self-pay | Admitting: Physician Assistant

## 2018-05-16 DIAGNOSIS — M545 Low back pain: Secondary | ICD-10-CM

## 2018-05-16 DIAGNOSIS — M47816 Spondylosis without myelopathy or radiculopathy, lumbar region: Secondary | ICD-10-CM | POA: Diagnosis not present

## 2018-05-16 DIAGNOSIS — M25569 Pain in unspecified knee: Secondary | ICD-10-CM | POA: Diagnosis not present

## 2018-05-16 DIAGNOSIS — G894 Chronic pain syndrome: Secondary | ICD-10-CM | POA: Diagnosis not present

## 2018-05-20 DIAGNOSIS — G40909 Epilepsy, unspecified, not intractable, without status epilepticus: Secondary | ICD-10-CM | POA: Diagnosis not present

## 2018-05-20 DIAGNOSIS — G43909 Migraine, unspecified, not intractable, without status migrainosus: Secondary | ICD-10-CM | POA: Diagnosis not present

## 2018-05-20 DIAGNOSIS — Z1331 Encounter for screening for depression: Secondary | ICD-10-CM | POA: Diagnosis not present

## 2018-05-20 DIAGNOSIS — Z7189 Other specified counseling: Secondary | ICD-10-CM | POA: Diagnosis not present

## 2018-06-13 DIAGNOSIS — M47816 Spondylosis without myelopathy or radiculopathy, lumbar region: Secondary | ICD-10-CM | POA: Diagnosis not present

## 2018-06-13 DIAGNOSIS — G894 Chronic pain syndrome: Secondary | ICD-10-CM | POA: Diagnosis not present

## 2018-06-13 DIAGNOSIS — M1732 Unilateral post-traumatic osteoarthritis, left knee: Secondary | ICD-10-CM | POA: Diagnosis not present

## 2018-06-13 DIAGNOSIS — M228X2 Other disorders of patella, left knee: Secondary | ICD-10-CM | POA: Diagnosis not present

## 2018-06-17 DIAGNOSIS — M170 Bilateral primary osteoarthritis of knee: Secondary | ICD-10-CM | POA: Diagnosis not present

## 2018-08-01 DIAGNOSIS — M228X2 Other disorders of patella, left knee: Secondary | ICD-10-CM | POA: Diagnosis not present

## 2018-08-01 DIAGNOSIS — Z79899 Other long term (current) drug therapy: Secondary | ICD-10-CM | POA: Diagnosis not present

## 2018-08-01 DIAGNOSIS — Z79891 Long term (current) use of opiate analgesic: Secondary | ICD-10-CM | POA: Diagnosis not present

## 2018-08-01 DIAGNOSIS — M1732 Unilateral post-traumatic osteoarthritis, left knee: Secondary | ICD-10-CM | POA: Diagnosis not present

## 2018-08-01 DIAGNOSIS — M47816 Spondylosis without myelopathy or radiculopathy, lumbar region: Secondary | ICD-10-CM | POA: Diagnosis not present

## 2018-08-01 DIAGNOSIS — G894 Chronic pain syndrome: Secondary | ICD-10-CM | POA: Diagnosis not present

## 2018-08-15 DIAGNOSIS — S83242A Other tear of medial meniscus, current injury, left knee, initial encounter: Secondary | ICD-10-CM | POA: Diagnosis not present

## 2018-08-15 DIAGNOSIS — S83241A Other tear of medial meniscus, current injury, right knee, initial encounter: Secondary | ICD-10-CM | POA: Diagnosis not present

## 2018-08-15 DIAGNOSIS — M172 Bilateral post-traumatic osteoarthritis of knee: Secondary | ICD-10-CM | POA: Diagnosis not present

## 2018-08-29 DIAGNOSIS — M47816 Spondylosis without myelopathy or radiculopathy, lumbar region: Secondary | ICD-10-CM | POA: Diagnosis not present

## 2018-08-29 DIAGNOSIS — M228X2 Other disorders of patella, left knee: Secondary | ICD-10-CM | POA: Diagnosis not present

## 2018-08-29 DIAGNOSIS — M1732 Unilateral post-traumatic osteoarthritis, left knee: Secondary | ICD-10-CM | POA: Diagnosis not present

## 2018-08-29 DIAGNOSIS — G894 Chronic pain syndrome: Secondary | ICD-10-CM | POA: Diagnosis not present

## 2018-09-10 DIAGNOSIS — M25561 Pain in right knee: Secondary | ICD-10-CM | POA: Diagnosis not present

## 2018-09-10 DIAGNOSIS — M25562 Pain in left knee: Secondary | ICD-10-CM | POA: Diagnosis not present

## 2018-09-10 DIAGNOSIS — M23331 Other meniscus derangements, other medial meniscus, right knee: Secondary | ICD-10-CM | POA: Diagnosis not present

## 2018-09-10 DIAGNOSIS — M94262 Chondromalacia, left knee: Secondary | ICD-10-CM | POA: Diagnosis not present

## 2018-09-10 DIAGNOSIS — M948X6 Other specified disorders of cartilage, lower leg: Secondary | ICD-10-CM | POA: Diagnosis not present

## 2018-09-10 DIAGNOSIS — M23332 Other meniscus derangements, other medial meniscus, left knee: Secondary | ICD-10-CM | POA: Diagnosis not present

## 2018-10-01 DIAGNOSIS — G894 Chronic pain syndrome: Secondary | ICD-10-CM | POA: Diagnosis not present

## 2018-10-01 DIAGNOSIS — Z79899 Other long term (current) drug therapy: Secondary | ICD-10-CM | POA: Diagnosis not present

## 2018-10-01 DIAGNOSIS — M1732 Unilateral post-traumatic osteoarthritis, left knee: Secondary | ICD-10-CM | POA: Diagnosis not present

## 2018-10-01 DIAGNOSIS — M228X2 Other disorders of patella, left knee: Secondary | ICD-10-CM | POA: Diagnosis not present

## 2018-10-01 DIAGNOSIS — M47816 Spondylosis without myelopathy or radiculopathy, lumbar region: Secondary | ICD-10-CM | POA: Diagnosis not present

## 2018-10-01 DIAGNOSIS — Z79891 Long term (current) use of opiate analgesic: Secondary | ICD-10-CM | POA: Diagnosis not present

## 2018-10-29 DIAGNOSIS — M228X2 Other disorders of patella, left knee: Secondary | ICD-10-CM | POA: Diagnosis not present

## 2018-10-29 DIAGNOSIS — M1732 Unilateral post-traumatic osteoarthritis, left knee: Secondary | ICD-10-CM | POA: Diagnosis not present

## 2018-10-29 DIAGNOSIS — M47816 Spondylosis without myelopathy or radiculopathy, lumbar region: Secondary | ICD-10-CM | POA: Diagnosis not present

## 2018-10-29 DIAGNOSIS — G894 Chronic pain syndrome: Secondary | ICD-10-CM | POA: Diagnosis not present

## 2018-12-05 ENCOUNTER — Encounter (HOSPITAL_COMMUNITY): Payer: Self-pay

## 2018-12-05 ENCOUNTER — Emergency Department (HOSPITAL_COMMUNITY): Payer: Medicare Other

## 2018-12-05 ENCOUNTER — Other Ambulatory Visit: Payer: Self-pay

## 2018-12-05 ENCOUNTER — Emergency Department (HOSPITAL_COMMUNITY)
Admission: EM | Admit: 2018-12-05 | Discharge: 2018-12-06 | Disposition: A | Discharge status: Home / Self Care | Payer: Medicare Other | Attending: Emergency Medicine | Admitting: Emergency Medicine

## 2018-12-05 DIAGNOSIS — M25562 Pain in left knee: Secondary | ICD-10-CM | POA: Diagnosis not present

## 2018-12-05 DIAGNOSIS — S8012XA Contusion of left lower leg, initial encounter: Secondary | ICD-10-CM | POA: Insufficient documentation

## 2018-12-05 DIAGNOSIS — S30811A Abrasion of abdominal wall, initial encounter: Secondary | ICD-10-CM | POA: Insufficient documentation

## 2018-12-05 DIAGNOSIS — F909 Attention-deficit hyperactivity disorder, unspecified type: Secondary | ICD-10-CM | POA: Diagnosis not present

## 2018-12-05 DIAGNOSIS — M25561 Pain in right knee: Secondary | ICD-10-CM | POA: Insufficient documentation

## 2018-12-05 DIAGNOSIS — M79662 Pain in left lower leg: Secondary | ICD-10-CM | POA: Diagnosis not present

## 2018-12-05 DIAGNOSIS — R109 Unspecified abdominal pain: Secondary | ICD-10-CM | POA: Diagnosis not present

## 2018-12-05 DIAGNOSIS — S80811A Abrasion, right lower leg, initial encounter: Secondary | ICD-10-CM | POA: Diagnosis not present

## 2018-12-05 DIAGNOSIS — M7918 Myalgia, other site: Secondary | ICD-10-CM

## 2018-12-05 DIAGNOSIS — S8992XA Unspecified injury of left lower leg, initial encounter: Secondary | ICD-10-CM | POA: Diagnosis not present

## 2018-12-05 DIAGNOSIS — Y939 Activity, unspecified: Secondary | ICD-10-CM | POA: Diagnosis not present

## 2018-12-05 DIAGNOSIS — Z79899 Other long term (current) drug therapy: Secondary | ICD-10-CM | POA: Insufficient documentation

## 2018-12-05 DIAGNOSIS — Y929 Unspecified place or not applicable: Secondary | ICD-10-CM | POA: Diagnosis not present

## 2018-12-05 DIAGNOSIS — T07XXXA Unspecified multiple injuries, initial encounter: Secondary | ICD-10-CM | POA: Diagnosis present

## 2018-12-05 DIAGNOSIS — I959 Hypotension, unspecified: Secondary | ICD-10-CM | POA: Diagnosis not present

## 2018-12-05 DIAGNOSIS — S3991XA Unspecified injury of abdomen, initial encounter: Secondary | ICD-10-CM | POA: Diagnosis not present

## 2018-12-05 DIAGNOSIS — M25552 Pain in left hip: Secondary | ICD-10-CM | POA: Insufficient documentation

## 2018-12-05 DIAGNOSIS — R0789 Other chest pain: Secondary | ICD-10-CM | POA: Insufficient documentation

## 2018-12-05 DIAGNOSIS — S299XXA Unspecified injury of thorax, initial encounter: Secondary | ICD-10-CM | POA: Diagnosis not present

## 2018-12-05 DIAGNOSIS — Y999 Unspecified external cause status: Secondary | ICD-10-CM | POA: Diagnosis not present

## 2018-12-05 DIAGNOSIS — S8991XA Unspecified injury of right lower leg, initial encounter: Secondary | ICD-10-CM | POA: Diagnosis not present

## 2018-12-05 DIAGNOSIS — R0902 Hypoxemia: Secondary | ICD-10-CM | POA: Diagnosis not present

## 2018-12-05 DIAGNOSIS — R079 Chest pain, unspecified: Secondary | ICD-10-CM | POA: Diagnosis not present

## 2018-12-05 LAB — CBC WITH DIFFERENTIAL/PLATELET
ABS IMMATURE GRANULOCYTES: 0.07 10*3/uL (ref 0.00–0.07)
Basophils Absolute: 0 10*3/uL (ref 0.0–0.1)
Basophils Relative: 0 %
Eosinophils Absolute: 0 10*3/uL (ref 0.0–0.5)
Eosinophils Relative: 0 %
HCT: 42.8 % (ref 36.0–46.0)
Hemoglobin: 13.5 g/dL (ref 12.0–15.0)
IMMATURE GRANULOCYTES: 0 %
Lymphocytes Relative: 6 %
Lymphs Abs: 1 10*3/uL (ref 0.7–4.0)
MCH: 30.1 pg (ref 26.0–34.0)
MCHC: 31.5 g/dL (ref 30.0–36.0)
MCV: 95.3 fL (ref 80.0–100.0)
MONOS PCT: 4 %
Monocytes Absolute: 0.6 10*3/uL (ref 0.1–1.0)
NEUTROS ABS: 14.6 10*3/uL — AB (ref 1.7–7.7)
Neutrophils Relative %: 90 %
Platelets: 289 10*3/uL (ref 150–400)
RBC: 4.49 MIL/uL (ref 3.87–5.11)
RDW: 12.1 % (ref 11.5–15.5)
WBC: 16.3 10*3/uL — ABNORMAL HIGH (ref 4.0–10.5)
nRBC: 0 % (ref 0.0–0.2)

## 2018-12-05 LAB — COMPREHENSIVE METABOLIC PANEL
ALT: 16 U/L (ref 0–44)
AST: 27 U/L (ref 15–41)
Albumin: 4.1 g/dL (ref 3.5–5.0)
Alkaline Phosphatase: 60 U/L (ref 38–126)
Anion gap: 12 (ref 5–15)
BUN: 7 mg/dL (ref 6–20)
CO2: 20 mmol/L — ABNORMAL LOW (ref 22–32)
Calcium: 9 mg/dL (ref 8.9–10.3)
Chloride: 105 mmol/L (ref 98–111)
Creatinine, Ser: 0.86 mg/dL (ref 0.44–1.00)
GFR calc Af Amer: >60 mL/min (ref >60)
GFR calc non Af Amer: >60 mL/min (ref >60)
Glucose, Bld: 106 mg/dL — ABNORMAL HIGH (ref 70–99)
Potassium: 3.3 mmol/L — ABNORMAL LOW (ref 3.5–5.1)
Sodium: 137 mmol/L (ref 135–145)
Total Bilirubin: 0.6 mg/dL (ref 0.3–1.2)
Total Protein: 7.1 g/dL (ref 6.5–8.1)

## 2018-12-05 LAB — I-STAT TROPONIN, ED: Troponin i, poc: 0.01 ng/mL (ref 0.00–0.08)

## 2018-12-05 LAB — I-STAT BETA HCG BLOOD, ED (MC, WL, AP ONLY): I-stat hCG, quantitative: <5 m[IU]/mL (ref <5)

## 2018-12-05 LAB — LIPASE, BLOOD: Lipase: 27 U/L (ref 11–51)

## 2018-12-05 MED ORDER — ONDANSETRON HCL 4 MG/2ML IJ SOLN
4.0000 mg | Freq: Once | INTRAMUSCULAR | Status: AC
  Administered 2018-12-05: 4 mg via INTRAVENOUS
  Filled 2018-12-05: qty 2

## 2018-12-05 MED ORDER — MORPHINE SULFATE (PF) 4 MG/ML IV SOLN
4.0000 mg | Freq: Once | INTRAVENOUS | Status: AC
  Administered 2018-12-05: 4 mg via INTRAVENOUS
  Filled 2018-12-05: qty 1

## 2018-12-05 MED ORDER — IOHEXOL 300 MG/ML  SOLN
100.0000 mL | Freq: Once | INTRAMUSCULAR | Status: AC | PRN
  Administered 2018-12-05: 100 mL via INTRAVENOUS

## 2018-12-05 MED ORDER — SODIUM CHLORIDE 0.9 % IV BOLUS
1000.0000 mL | Freq: Once | INTRAVENOUS | Status: AC
  Administered 2018-12-05: 1000 mL via INTRAVENOUS
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
## 2018-12-05 NOTE — Discharge Instructions (Signed)
Please follow-up with your primary care physician in the next couple days.   You will likely be sore for the next week or two. Please take tylenol as needed for your pain. Follow-up with your orthopedic surgeon as well for your recent knee surgery.   If you start to have worsening abdominal or chest pain, shortness of breath or other concerning symptoms, please return to the emergency department.

## 2018-12-06 NOTE — ED Notes (Signed)
Patient verbalizes understanding of discharge instructions. Opportunity for questioning and answers were provided. Armband removed by staff, pt discharged from ED in wheelchair.  

## 2018-12-12 DIAGNOSIS — S8002XD Contusion of left knee, subsequent encounter: Secondary | ICD-10-CM | POA: Diagnosis not present

## 2018-12-12 DIAGNOSIS — M25561 Pain in right knee: Secondary | ICD-10-CM | POA: Diagnosis not present

## 2018-12-12 DIAGNOSIS — M25562 Pain in left knee: Secondary | ICD-10-CM | POA: Diagnosis not present

## 2018-12-17 DIAGNOSIS — M228X2 Other disorders of patella, left knee: Secondary | ICD-10-CM | POA: Diagnosis not present

## 2018-12-17 DIAGNOSIS — M1732 Unilateral post-traumatic osteoarthritis, left knee: Secondary | ICD-10-CM | POA: Diagnosis not present

## 2018-12-17 DIAGNOSIS — Z79891 Long term (current) use of opiate analgesic: Secondary | ICD-10-CM | POA: Diagnosis not present

## 2018-12-17 DIAGNOSIS — M47816 Spondylosis without myelopathy or radiculopathy, lumbar region: Secondary | ICD-10-CM | POA: Diagnosis not present

## 2018-12-17 DIAGNOSIS — G894 Chronic pain syndrome: Secondary | ICD-10-CM | POA: Diagnosis not present

## 2018-12-17 DIAGNOSIS — Z79899 Other long term (current) drug therapy: Secondary | ICD-10-CM | POA: Diagnosis not present

## 2018-12-24 ENCOUNTER — Other Ambulatory Visit: Payer: Self-pay | Admitting: Physician Assistant

## 2018-12-24 ENCOUNTER — Ambulatory Visit
Admission: RE | Admit: 2018-12-24 | Discharge: 2018-12-24 | Disposition: A | Discharge status: Home / Self Care | Payer: Medicaid Other | Source: Ambulatory Visit | Attending: Physician Assistant | Admitting: Physician Assistant

## 2018-12-24 DIAGNOSIS — S199XXA Unspecified injury of neck, initial encounter: Secondary | ICD-10-CM | POA: Diagnosis not present

## 2018-12-24 DIAGNOSIS — M546 Pain in thoracic spine: Secondary | ICD-10-CM | POA: Diagnosis not present

## 2018-12-24 DIAGNOSIS — M542 Cervicalgia: Secondary | ICD-10-CM | POA: Diagnosis not present

## 2018-12-24 DIAGNOSIS — M545 Low back pain: Secondary | ICD-10-CM | POA: Diagnosis not present

## 2018-12-24 DIAGNOSIS — R52 Pain, unspecified: Secondary | ICD-10-CM

## 2018-12-24 DIAGNOSIS — S299XXA Unspecified injury of thorax, initial encounter: Secondary | ICD-10-CM | POA: Diagnosis not present

## 2019-01-07 DIAGNOSIS — K219 Gastro-esophageal reflux disease without esophagitis: Secondary | ICD-10-CM | POA: Diagnosis not present

## 2019-01-07 DIAGNOSIS — G43909 Migraine, unspecified, not intractable, without status migrainosus: Secondary | ICD-10-CM | POA: Diagnosis not present

## 2019-01-09 DIAGNOSIS — M25561 Pain in right knee: Secondary | ICD-10-CM | POA: Diagnosis not present

## 2019-01-09 DIAGNOSIS — M25562 Pain in left knee: Secondary | ICD-10-CM | POA: Diagnosis not present

## 2019-01-21 DIAGNOSIS — M47816 Spondylosis without myelopathy or radiculopathy, lumbar region: Secondary | ICD-10-CM | POA: Diagnosis not present

## 2019-01-21 DIAGNOSIS — M1732 Unilateral post-traumatic osteoarthritis, left knee: Secondary | ICD-10-CM | POA: Diagnosis not present

## 2019-01-21 DIAGNOSIS — M228X2 Other disorders of patella, left knee: Secondary | ICD-10-CM | POA: Diagnosis not present

## 2019-01-21 DIAGNOSIS — G894 Chronic pain syndrome: Secondary | ICD-10-CM | POA: Diagnosis not present

## 2019-02-18 DIAGNOSIS — G894 Chronic pain syndrome: Secondary | ICD-10-CM | POA: Diagnosis not present

## 2019-02-18 DIAGNOSIS — M47816 Spondylosis without myelopathy or radiculopathy, lumbar region: Secondary | ICD-10-CM | POA: Diagnosis not present

## 2019-02-18 DIAGNOSIS — M1732 Unilateral post-traumatic osteoarthritis, left knee: Secondary | ICD-10-CM | POA: Diagnosis not present

## 2019-02-18 DIAGNOSIS — M228X2 Other disorders of patella, left knee: Secondary | ICD-10-CM | POA: Diagnosis not present

## 2019-03-06 DIAGNOSIS — M25561 Pain in right knee: Secondary | ICD-10-CM | POA: Diagnosis not present

## 2019-03-06 DIAGNOSIS — M25562 Pain in left knee: Secondary | ICD-10-CM | POA: Diagnosis not present

## 2019-03-12 ENCOUNTER — Other Ambulatory Visit: Payer: Self-pay | Admitting: Orthopedic Surgery

## 2019-03-12 DIAGNOSIS — M25562 Pain in left knee: Secondary | ICD-10-CM

## 2019-03-18 DIAGNOSIS — G894 Chronic pain syndrome: Secondary | ICD-10-CM | POA: Diagnosis not present

## 2019-03-18 DIAGNOSIS — M47816 Spondylosis without myelopathy or radiculopathy, lumbar region: Secondary | ICD-10-CM | POA: Diagnosis not present

## 2019-03-18 DIAGNOSIS — Z79899 Other long term (current) drug therapy: Secondary | ICD-10-CM | POA: Diagnosis not present

## 2019-03-18 DIAGNOSIS — Z79891 Long term (current) use of opiate analgesic: Secondary | ICD-10-CM | POA: Diagnosis not present

## 2019-03-18 DIAGNOSIS — M1732 Unilateral post-traumatic osteoarthritis, left knee: Secondary | ICD-10-CM | POA: Diagnosis not present

## 2019-03-18 DIAGNOSIS — M228X2 Other disorders of patella, left knee: Secondary | ICD-10-CM | POA: Diagnosis not present

## 2019-04-09 DIAGNOSIS — Z6824 Body mass index (BMI) 24.0-24.9, adult: Secondary | ICD-10-CM | POA: Diagnosis not present

## 2019-04-09 DIAGNOSIS — G43909 Migraine, unspecified, not intractable, without status migrainosus: Secondary | ICD-10-CM | POA: Diagnosis not present

## 2019-04-16 DIAGNOSIS — G894 Chronic pain syndrome: Secondary | ICD-10-CM | POA: Diagnosis not present

## 2019-04-16 DIAGNOSIS — M1732 Unilateral post-traumatic osteoarthritis, left knee: Secondary | ICD-10-CM | POA: Diagnosis not present

## 2019-04-16 DIAGNOSIS — M47816 Spondylosis without myelopathy or radiculopathy, lumbar region: Secondary | ICD-10-CM | POA: Diagnosis not present

## 2019-04-16 DIAGNOSIS — M228X2 Other disorders of patella, left knee: Secondary | ICD-10-CM | POA: Diagnosis not present

## 2019-05-01 ENCOUNTER — Encounter (INDEPENDENT_AMBULATORY_CARE_PROVIDER_SITE_OTHER): Payer: Self-pay

## 2019-05-14 DIAGNOSIS — M2391 Unspecified internal derangement of right knee: Secondary | ICD-10-CM | POA: Diagnosis not present

## 2019-05-14 DIAGNOSIS — G894 Chronic pain syndrome: Secondary | ICD-10-CM | POA: Diagnosis not present

## 2019-05-14 DIAGNOSIS — M4306 Spondylolysis, lumbar region: Secondary | ICD-10-CM | POA: Diagnosis not present

## 2019-05-14 DIAGNOSIS — M47817 Spondylosis without myelopathy or radiculopathy, lumbosacral region: Secondary | ICD-10-CM | POA: Diagnosis not present

## 2019-05-27 ENCOUNTER — Other Ambulatory Visit: Payer: Self-pay | Admitting: Orthopedic Surgery

## 2019-05-27 DIAGNOSIS — M25561 Pain in right knee: Secondary | ICD-10-CM

## 2019-05-27 DIAGNOSIS — M25562 Pain in left knee: Secondary | ICD-10-CM

## 2019-06-04 ENCOUNTER — Other Ambulatory Visit (HOSPITAL_COMMUNITY): Payer: Self-pay | Admitting: Orthopedic Surgery

## 2019-06-04 DIAGNOSIS — M25561 Pain in right knee: Secondary | ICD-10-CM

## 2019-06-04 DIAGNOSIS — M25562 Pain in left knee: Secondary | ICD-10-CM

## 2019-06-10 ENCOUNTER — Encounter: Payer: Self-pay | Admitting: *Deleted

## 2019-06-10 DIAGNOSIS — M1732 Unilateral post-traumatic osteoarthritis, left knee: Secondary | ICD-10-CM | POA: Diagnosis not present

## 2019-06-10 DIAGNOSIS — M47816 Spondylosis without myelopathy or radiculopathy, lumbar region: Secondary | ICD-10-CM | POA: Diagnosis not present

## 2019-06-10 DIAGNOSIS — M228X2 Other disorders of patella, left knee: Secondary | ICD-10-CM | POA: Diagnosis not present

## 2019-06-10 DIAGNOSIS — G894 Chronic pain syndrome: Secondary | ICD-10-CM | POA: Diagnosis not present

## 2019-06-11 ENCOUNTER — Other Ambulatory Visit: Payer: Self-pay

## 2019-06-11 ENCOUNTER — Telehealth: Payer: Self-pay | Admitting: *Deleted

## 2019-06-11 ENCOUNTER — Encounter: Payer: Self-pay | Admitting: Diagnostic Neuroimaging

## 2019-06-11 ENCOUNTER — Ambulatory Visit (INDEPENDENT_AMBULATORY_CARE_PROVIDER_SITE_OTHER): Payer: Medicare Other | Admitting: Diagnostic Neuroimaging

## 2019-06-11 VITALS — BP 130/90 | HR 76 | Temp 97.8°F | Ht 68.0 in | Wt 149.0 lb

## 2019-06-11 DIAGNOSIS — G43009 Migraine without aura, not intractable, without status migrainosus: Secondary | ICD-10-CM | POA: Diagnosis not present

## 2019-06-11 MED ORDER — AIMOVIG 70 MG/ML ~~LOC~~ SOAJ: 70.0000 mg | SUBCUTANEOUS | 4 refills | Status: AC

## 2019-06-11 NOTE — Telephone Encounter (Signed)
Started [employer] PA on CMM, key: [id number]. Dx: E83.151, daily migraine headaches.  Failed: sumatriptan, topiramate, gabapentin.

## 2019-06-11 NOTE — Telephone Encounter (Signed)
[employer] approved through 10/23/2019 under Medicare Part D. Approval letter faxed to [facility], [place].
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
## 2019-06-11 NOTE — Patient Instructions (Signed)
-   Start aimovig 70mg  monthly injections (for migraine prevention)  - continue topiramate, sumatriptan, gabapentin

## 2019-07-09 DIAGNOSIS — Z0000 Encounter for general adult medical examination without abnormal findings: Secondary | ICD-10-CM | POA: Diagnosis not present

## 2019-07-09 DIAGNOSIS — J3089 Other allergic rhinitis: Secondary | ICD-10-CM | POA: Diagnosis not present

## 2019-07-09 DIAGNOSIS — G43909 Migraine, unspecified, not intractable, without status migrainosus: Secondary | ICD-10-CM | POA: Diagnosis not present

## 2019-07-09 DIAGNOSIS — Z7189 Other specified counseling: Secondary | ICD-10-CM | POA: Diagnosis not present

## 2019-07-09 DIAGNOSIS — Z13220 Encounter for screening for lipoid disorders: Secondary | ICD-10-CM | POA: Diagnosis not present

## 2019-07-09 DIAGNOSIS — Z139 Encounter for screening, unspecified: Secondary | ICD-10-CM | POA: Diagnosis not present

## 2019-07-22 DIAGNOSIS — Z79891 Long term (current) use of opiate analgesic: Secondary | ICD-10-CM | POA: Diagnosis not present

## 2019-07-22 DIAGNOSIS — M47816 Spondylosis without myelopathy or radiculopathy, lumbar region: Secondary | ICD-10-CM | POA: Diagnosis not present

## 2019-07-22 DIAGNOSIS — G894 Chronic pain syndrome: Secondary | ICD-10-CM | POA: Diagnosis not present

## 2019-07-22 DIAGNOSIS — M1732 Unilateral post-traumatic osteoarthritis, left knee: Secondary | ICD-10-CM | POA: Diagnosis not present

## 2019-07-22 DIAGNOSIS — M228X2 Other disorders of patella, left knee: Secondary | ICD-10-CM | POA: Diagnosis not present

## 2019-07-22 DIAGNOSIS — Z79899 Other long term (current) drug therapy: Secondary | ICD-10-CM | POA: Diagnosis not present

## 2019-08-07 DIAGNOSIS — J029 Acute pharyngitis, unspecified: Secondary | ICD-10-CM | POA: Diagnosis not present

## 2019-08-07 DIAGNOSIS — R6889 Other general symptoms and signs: Secondary | ICD-10-CM | POA: Diagnosis not present

## 2019-08-14 DIAGNOSIS — J029 Acute pharyngitis, unspecified: Secondary | ICD-10-CM | POA: Diagnosis not present

## 2019-08-14 DIAGNOSIS — R0989 Other specified symptoms and signs involving the circulatory and respiratory systems: Secondary | ICD-10-CM | POA: Diagnosis not present

## 2019-08-14 DIAGNOSIS — R109 Unspecified abdominal pain: Secondary | ICD-10-CM | POA: Diagnosis not present

## 2019-08-20 DIAGNOSIS — R509 Fever, unspecified: Secondary | ICD-10-CM | POA: Diagnosis not present

## 2019-08-20 DIAGNOSIS — R0989 Other specified symptoms and signs involving the circulatory and respiratory systems: Secondary | ICD-10-CM | POA: Diagnosis not present

## 2019-08-26 DIAGNOSIS — M47816 Spondylosis without myelopathy or radiculopathy, lumbar region: Secondary | ICD-10-CM | POA: Diagnosis not present

## 2019-08-26 DIAGNOSIS — M228X2 Other disorders of patella, left knee: Secondary | ICD-10-CM | POA: Diagnosis not present

## 2019-08-26 DIAGNOSIS — G894 Chronic pain syndrome: Secondary | ICD-10-CM | POA: Diagnosis not present

## 2019-08-26 DIAGNOSIS — M1732 Unilateral post-traumatic osteoarthritis, left knee: Secondary | ICD-10-CM | POA: Diagnosis not present

## 2019-10-14 DIAGNOSIS — Z79891 Long term (current) use of opiate analgesic: Secondary | ICD-10-CM | POA: Diagnosis not present

## 2019-10-14 DIAGNOSIS — Z79899 Other long term (current) drug therapy: Secondary | ICD-10-CM | POA: Diagnosis not present

## 2019-10-14 DIAGNOSIS — M228X2 Other disorders of patella, left knee: Secondary | ICD-10-CM | POA: Diagnosis not present

## 2019-10-14 DIAGNOSIS — G894 Chronic pain syndrome: Secondary | ICD-10-CM | POA: Diagnosis not present

## 2019-10-14 DIAGNOSIS — M1732 Unilateral post-traumatic osteoarthritis, left knee: Secondary | ICD-10-CM | POA: Diagnosis not present

## 2019-10-14 DIAGNOSIS — M47816 Spondylosis without myelopathy or radiculopathy, lumbar region: Secondary | ICD-10-CM | POA: Diagnosis not present

## 2019-11-18 DIAGNOSIS — M1732 Unilateral post-traumatic osteoarthritis, left knee: Secondary | ICD-10-CM | POA: Diagnosis not present

## 2019-11-18 DIAGNOSIS — M47816 Spondylosis without myelopathy or radiculopathy, lumbar region: Secondary | ICD-10-CM | POA: Diagnosis not present

## 2019-11-18 DIAGNOSIS — G894 Chronic pain syndrome: Secondary | ICD-10-CM | POA: Diagnosis not present

## 2019-11-18 DIAGNOSIS — M228X2 Other disorders of patella, left knee: Secondary | ICD-10-CM | POA: Diagnosis not present

## 2019-12-15 ENCOUNTER — Ambulatory Visit: Payer: Medicare Other | Admitting: Family Medicine

## 2020-01-05 DIAGNOSIS — Z79899 Other long term (current) drug therapy: Secondary | ICD-10-CM | POA: Diagnosis not present

## 2020-01-05 DIAGNOSIS — M1732 Unilateral post-traumatic osteoarthritis, left knee: Secondary | ICD-10-CM | POA: Diagnosis not present

## 2020-01-05 DIAGNOSIS — M47816 Spondylosis without myelopathy or radiculopathy, lumbar region: Secondary | ICD-10-CM | POA: Diagnosis not present

## 2020-01-05 DIAGNOSIS — G894 Chronic pain syndrome: Secondary | ICD-10-CM | POA: Diagnosis not present

## 2020-01-05 DIAGNOSIS — Z79891 Long term (current) use of opiate analgesic: Secondary | ICD-10-CM | POA: Diagnosis not present

## 2020-01-14 DIAGNOSIS — Z1231 Encounter for screening mammogram for malignant neoplasm of breast: Secondary | ICD-10-CM | POA: Diagnosis not present

## 2020-01-14 DIAGNOSIS — N946 Dysmenorrhea, unspecified: Secondary | ICD-10-CM | POA: Diagnosis not present

## 2020-01-14 DIAGNOSIS — R87610 Atypical squamous cells of undetermined significance on cytologic smear of cervix (ASC-US): Secondary | ICD-10-CM | POA: Diagnosis not present

## 2020-02-16 DIAGNOSIS — M1732 Unilateral post-traumatic osteoarthritis, left knee: Secondary | ICD-10-CM | POA: Diagnosis not present

## 2020-02-16 DIAGNOSIS — M47816 Spondylosis without myelopathy or radiculopathy, lumbar region: Secondary | ICD-10-CM | POA: Diagnosis not present

## 2020-02-16 DIAGNOSIS — G894 Chronic pain syndrome: Secondary | ICD-10-CM | POA: Diagnosis not present

## 2020-02-16 DIAGNOSIS — M25569 Pain in unspecified knee: Secondary | ICD-10-CM | POA: Diagnosis not present

## 2020-03-15 DIAGNOSIS — N946 Dysmenorrhea, unspecified: Secondary | ICD-10-CM | POA: Diagnosis not present

## 2020-03-16 DIAGNOSIS — G894 Chronic pain syndrome: Secondary | ICD-10-CM | POA: Diagnosis not present

## 2020-03-16 DIAGNOSIS — M1732 Unilateral post-traumatic osteoarthritis, left knee: Secondary | ICD-10-CM | POA: Diagnosis not present

## 2020-03-16 DIAGNOSIS — M47816 Spondylosis without myelopathy or radiculopathy, lumbar region: Secondary | ICD-10-CM | POA: Diagnosis not present

## 2020-03-16 DIAGNOSIS — M228X2 Other disorders of patella, left knee: Secondary | ICD-10-CM | POA: Diagnosis not present

## 2020-04-13 DIAGNOSIS — Z79899 Other long term (current) drug therapy: Secondary | ICD-10-CM | POA: Diagnosis not present

## 2020-04-13 DIAGNOSIS — G894 Chronic pain syndrome: Secondary | ICD-10-CM | POA: Diagnosis not present

## 2020-04-13 DIAGNOSIS — Z79891 Long term (current) use of opiate analgesic: Secondary | ICD-10-CM | POA: Diagnosis not present

## 2020-04-13 DIAGNOSIS — M47816 Spondylosis without myelopathy or radiculopathy, lumbar region: Secondary | ICD-10-CM | POA: Diagnosis not present

## 2020-04-13 DIAGNOSIS — Z4542 Encounter for adjustment and management of neuropacemaker (brain) (peripheral nerve) (spinal cord): Secondary | ICD-10-CM | POA: Diagnosis not present

## 2020-04-13 DIAGNOSIS — M1732 Unilateral post-traumatic osteoarthritis, left knee: Secondary | ICD-10-CM | POA: Diagnosis not present

## 2020-04-13 DIAGNOSIS — M228X2 Other disorders of patella, left knee: Secondary | ICD-10-CM | POA: Diagnosis not present

## 2020-05-14 DIAGNOSIS — M47816 Spondylosis without myelopathy or radiculopathy, lumbar region: Secondary | ICD-10-CM | POA: Diagnosis not present

## 2020-05-14 DIAGNOSIS — G894 Chronic pain syndrome: Secondary | ICD-10-CM | POA: Diagnosis not present

## 2020-05-14 DIAGNOSIS — M1732 Unilateral post-traumatic osteoarthritis, left knee: Secondary | ICD-10-CM | POA: Diagnosis not present

## 2020-05-14 DIAGNOSIS — M228X2 Other disorders of patella, left knee: Secondary | ICD-10-CM | POA: Diagnosis not present

## 2020-06-10 DIAGNOSIS — G43909 Migraine, unspecified, not intractable, without status migrainosus: Secondary | ICD-10-CM | POA: Diagnosis not present

## 2020-06-10 DIAGNOSIS — G8929 Other chronic pain: Secondary | ICD-10-CM | POA: Diagnosis not present

## 2020-06-10 DIAGNOSIS — M545 Low back pain: Secondary | ICD-10-CM | POA: Diagnosis not present

## 2020-06-15 DIAGNOSIS — M1732 Unilateral post-traumatic osteoarthritis, left knee: Secondary | ICD-10-CM | POA: Diagnosis not present

## 2020-06-15 DIAGNOSIS — G894 Chronic pain syndrome: Secondary | ICD-10-CM | POA: Diagnosis not present

## 2020-06-15 DIAGNOSIS — M47816 Spondylosis without myelopathy or radiculopathy, lumbar region: Secondary | ICD-10-CM | POA: Diagnosis not present

## 2020-06-15 DIAGNOSIS — M228X2 Other disorders of patella, left knee: Secondary | ICD-10-CM | POA: Diagnosis not present

## 2020-06-28 ENCOUNTER — Other Ambulatory Visit: Payer: Self-pay | Admitting: Diagnostic Neuroimaging

## 2020-06-30 ENCOUNTER — Other Ambulatory Visit: Payer: Self-pay | Admitting: Diagnostic Neuroimaging

## 2020-07-08 DIAGNOSIS — Z20822 Contact with and (suspected) exposure to covid-19: Secondary | ICD-10-CM | POA: Diagnosis not present

## 2020-07-08 DIAGNOSIS — J029 Acute pharyngitis, unspecified: Secondary | ICD-10-CM | POA: Diagnosis not present

## 2020-07-15 DIAGNOSIS — G894 Chronic pain syndrome: Secondary | ICD-10-CM | POA: Diagnosis not present

## 2020-07-15 DIAGNOSIS — M1732 Unilateral post-traumatic osteoarthritis, left knee: Secondary | ICD-10-CM | POA: Diagnosis not present

## 2020-07-15 DIAGNOSIS — Z79899 Other long term (current) drug therapy: Secondary | ICD-10-CM | POA: Diagnosis not present

## 2020-07-15 DIAGNOSIS — Z79891 Long term (current) use of opiate analgesic: Secondary | ICD-10-CM | POA: Diagnosis not present

## 2020-07-15 DIAGNOSIS — M47816 Spondylosis without myelopathy or radiculopathy, lumbar region: Secondary | ICD-10-CM | POA: Diagnosis not present

## 2020-07-15 DIAGNOSIS — M228X2 Other disorders of patella, left knee: Secondary | ICD-10-CM | POA: Diagnosis not present

## 2020-08-12 DIAGNOSIS — M1732 Unilateral post-traumatic osteoarthritis, left knee: Secondary | ICD-10-CM | POA: Diagnosis not present

## 2020-08-12 DIAGNOSIS — M228X2 Other disorders of patella, left knee: Secondary | ICD-10-CM | POA: Diagnosis not present

## 2020-08-12 DIAGNOSIS — M47816 Spondylosis without myelopathy or radiculopathy, lumbar region: Secondary | ICD-10-CM | POA: Diagnosis not present

## 2020-08-12 DIAGNOSIS — G894 Chronic pain syndrome: Secondary | ICD-10-CM | POA: Diagnosis not present
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
## 2020-09-14 DIAGNOSIS — M47816 Spondylosis without myelopathy or radiculopathy, lumbar region: Secondary | ICD-10-CM | POA: Diagnosis not present

## 2020-09-14 DIAGNOSIS — M1732 Unilateral post-traumatic osteoarthritis, left knee: Secondary | ICD-10-CM | POA: Diagnosis not present

## 2020-09-14 DIAGNOSIS — M228X2 Other disorders of patella, left knee: Secondary | ICD-10-CM | POA: Diagnosis not present

## 2020-09-14 DIAGNOSIS — G894 Chronic pain syndrome: Secondary | ICD-10-CM | POA: Diagnosis not present

## 2020-10-18 DIAGNOSIS — R9389 Abnormal findings on diagnostic imaging of other specified body structures: Secondary | ICD-10-CM | POA: Diagnosis not present

## 2020-10-19 DIAGNOSIS — M47816 Spondylosis without myelopathy or radiculopathy, lumbar region: Secondary | ICD-10-CM | POA: Diagnosis not present

## 2020-10-19 DIAGNOSIS — Z79891 Long term (current) use of opiate analgesic: Secondary | ICD-10-CM | POA: Diagnosis not present

## 2020-10-19 DIAGNOSIS — G894 Chronic pain syndrome: Secondary | ICD-10-CM | POA: Diagnosis not present

## 2020-10-19 DIAGNOSIS — M228X2 Other disorders of patella, left knee: Secondary | ICD-10-CM | POA: Diagnosis not present

## 2020-10-19 DIAGNOSIS — M1732 Unilateral post-traumatic osteoarthritis, left knee: Secondary | ICD-10-CM | POA: Diagnosis not present

## 2020-10-19 DIAGNOSIS — Z79899 Other long term (current) drug therapy: Secondary | ICD-10-CM | POA: Diagnosis not present

## 2020-11-18 DIAGNOSIS — M2241 Chondromalacia patellae, right knee: Secondary | ICD-10-CM | POA: Diagnosis not present

## 2020-11-18 DIAGNOSIS — M170 Bilateral primary osteoarthritis of knee: Secondary | ICD-10-CM | POA: Diagnosis not present

## 2020-11-18 DIAGNOSIS — M2242 Chondromalacia patellae, left knee: Secondary | ICD-10-CM | POA: Diagnosis not present

## 2020-11-23 DIAGNOSIS — M228X2 Other disorders of patella, left knee: Secondary | ICD-10-CM | POA: Diagnosis not present

## 2020-11-23 DIAGNOSIS — M1732 Unilateral post-traumatic osteoarthritis, left knee: Secondary | ICD-10-CM | POA: Diagnosis not present

## 2020-11-23 DIAGNOSIS — M47816 Spondylosis without myelopathy or radiculopathy, lumbar region: Secondary | ICD-10-CM | POA: Diagnosis not present

## 2020-11-23 DIAGNOSIS — G894 Chronic pain syndrome: Secondary | ICD-10-CM | POA: Diagnosis not present

## 2020-12-23 DIAGNOSIS — Z79899 Other long term (current) drug therapy: Secondary | ICD-10-CM | POA: Diagnosis not present

## 2020-12-23 DIAGNOSIS — Z79891 Long term (current) use of opiate analgesic: Secondary | ICD-10-CM | POA: Diagnosis not present

## 2020-12-23 DIAGNOSIS — G894 Chronic pain syndrome: Secondary | ICD-10-CM | POA: Diagnosis not present

## 2020-12-23 DIAGNOSIS — M1732 Unilateral post-traumatic osteoarthritis, left knee: Secondary | ICD-10-CM | POA: Diagnosis not present

## 2020-12-23 DIAGNOSIS — M47816 Spondylosis without myelopathy or radiculopathy, lumbar region: Secondary | ICD-10-CM | POA: Diagnosis not present

## 2020-12-23 DIAGNOSIS — M228X2 Other disorders of patella, left knee: Secondary | ICD-10-CM | POA: Diagnosis not present

## 2021-01-12 DIAGNOSIS — M792 Neuralgia and neuritis, unspecified: Secondary | ICD-10-CM | POA: Diagnosis not present

## 2021-01-12 DIAGNOSIS — G894 Chronic pain syndrome: Secondary | ICD-10-CM | POA: Diagnosis not present

## 2021-01-20 DIAGNOSIS — M79604 Pain in right leg: Secondary | ICD-10-CM | POA: Diagnosis not present

## 2021-01-20 DIAGNOSIS — G894 Chronic pain syndrome: Secondary | ICD-10-CM | POA: Diagnosis not present

## 2021-01-20 DIAGNOSIS — M4306 Spondylolysis, lumbar region: Secondary | ICD-10-CM | POA: Diagnosis not present

## 2021-01-20 DIAGNOSIS — M47817 Spondylosis without myelopathy or radiculopathy, lumbosacral region: Secondary | ICD-10-CM | POA: Diagnosis not present

## 2021-01-25 DIAGNOSIS — M549 Dorsalgia, unspecified: Secondary | ICD-10-CM | POA: Diagnosis not present

## 2021-01-25 DIAGNOSIS — M47816 Spondylosis without myelopathy or radiculopathy, lumbar region: Secondary | ICD-10-CM | POA: Diagnosis not present

## 2021-02-15 DIAGNOSIS — M79604 Pain in right leg: Secondary | ICD-10-CM | POA: Diagnosis not present

## 2021-02-15 DIAGNOSIS — G894 Chronic pain syndrome: Secondary | ICD-10-CM | POA: Diagnosis not present

## 2021-02-15 DIAGNOSIS — M47817 Spondylosis without myelopathy or radiculopathy, lumbosacral region: Secondary | ICD-10-CM | POA: Diagnosis not present

## 2021-02-15 DIAGNOSIS — M2391 Unspecified internal derangement of right knee: Secondary | ICD-10-CM | POA: Diagnosis not present

## 2021-03-16 DIAGNOSIS — M2391 Unspecified internal derangement of right knee: Secondary | ICD-10-CM | POA: Diagnosis not present

## 2021-03-16 DIAGNOSIS — M79604 Pain in right leg: Secondary | ICD-10-CM | POA: Diagnosis not present

## 2021-03-16 DIAGNOSIS — Z79891 Long term (current) use of opiate analgesic: Secondary | ICD-10-CM | POA: Diagnosis not present

## 2021-03-16 DIAGNOSIS — M47817 Spondylosis without myelopathy or radiculopathy, lumbosacral region: Secondary | ICD-10-CM | POA: Diagnosis not present

## 2021-03-16 DIAGNOSIS — G894 Chronic pain syndrome: Secondary | ICD-10-CM | POA: Diagnosis not present

## 2021-03-16 DIAGNOSIS — Z79899 Other long term (current) drug therapy: Secondary | ICD-10-CM | POA: Diagnosis not present

## 2021-04-05 DIAGNOSIS — M228X2 Other disorders of patella, left knee: Secondary | ICD-10-CM | POA: Diagnosis not present

## 2021-04-05 DIAGNOSIS — M25561 Pain in right knee: Secondary | ICD-10-CM | POA: Diagnosis not present

## 2021-04-05 DIAGNOSIS — M1732 Unilateral post-traumatic osteoarthritis, left knee: Secondary | ICD-10-CM | POA: Diagnosis not present

## 2021-04-14 DIAGNOSIS — M79604 Pain in right leg: Secondary | ICD-10-CM | POA: Diagnosis not present

## 2021-04-14 DIAGNOSIS — M47817 Spondylosis without myelopathy or radiculopathy, lumbosacral region: Secondary | ICD-10-CM | POA: Diagnosis not present

## 2021-04-14 DIAGNOSIS — G894 Chronic pain syndrome: Secondary | ICD-10-CM | POA: Diagnosis not present

## 2021-04-14 DIAGNOSIS — M2391 Unspecified internal derangement of right knee: Secondary | ICD-10-CM | POA: Diagnosis not present

## 2021-05-06 DIAGNOSIS — D219 Benign neoplasm of connective and other soft tissue, unspecified: Secondary | ICD-10-CM | POA: Diagnosis not present

## 2021-05-17 DIAGNOSIS — M2391 Unspecified internal derangement of right knee: Secondary | ICD-10-CM | POA: Diagnosis not present

## 2021-05-17 DIAGNOSIS — M1732 Unilateral post-traumatic osteoarthritis, left knee: Secondary | ICD-10-CM | POA: Diagnosis not present

## 2021-05-17 DIAGNOSIS — G894 Chronic pain syndrome: Secondary | ICD-10-CM | POA: Diagnosis not present

## 2021-05-17 DIAGNOSIS — M47817 Spondylosis without myelopathy or radiculopathy, lumbosacral region: Secondary | ICD-10-CM | POA: Diagnosis not present

## 2021-05-17 DIAGNOSIS — M228X2 Other disorders of patella, left knee: Secondary | ICD-10-CM | POA: Diagnosis not present

## 2021-05-21 DIAGNOSIS — Z85528 Personal history of other malignant neoplasm of kidney: Secondary | ICD-10-CM | POA: Diagnosis not present

## 2021-05-21 DIAGNOSIS — R42 Dizziness and giddiness: Secondary | ICD-10-CM | POA: Diagnosis not present

## 2021-05-21 DIAGNOSIS — Z862 Personal history of diseases of the blood and blood-forming organs and certain disorders involving the immune mechanism: Secondary | ICD-10-CM | POA: Diagnosis not present

## 2021-05-21 DIAGNOSIS — Z882 Allergy status to sulfonamides status: Secondary | ICD-10-CM | POA: Diagnosis not present

## 2021-05-21 DIAGNOSIS — G2581 Restless legs syndrome: Secondary | ICD-10-CM | POA: Diagnosis not present

## 2021-05-21 DIAGNOSIS — K219 Gastro-esophageal reflux disease without esophagitis: Secondary | ICD-10-CM | POA: Diagnosis not present

## 2021-05-21 DIAGNOSIS — R590 Localized enlarged lymph nodes: Secondary | ICD-10-CM | POA: Diagnosis not present

## 2021-05-21 DIAGNOSIS — Z885 Allergy status to narcotic agent status: Secondary | ICD-10-CM | POA: Diagnosis not present

## 2021-05-21 DIAGNOSIS — Z79899 Other long term (current) drug therapy: Secondary | ICD-10-CM | POA: Diagnosis not present

## 2021-05-21 DIAGNOSIS — Z86018 Personal history of other benign neoplasm: Secondary | ICD-10-CM | POA: Diagnosis not present

## 2021-05-21 DIAGNOSIS — I889 Nonspecific lymphadenitis, unspecified: Secondary | ICD-10-CM | POA: Diagnosis not present

## 2021-05-21 DIAGNOSIS — R000 Tachycardia, unspecified: Secondary | ICD-10-CM | POA: Diagnosis not present

## 2021-05-21 DIAGNOSIS — Z888 Allergy status to other drugs, medicaments and biological substances status: Secondary | ICD-10-CM | POA: Diagnosis not present

## 2021-05-21 DIAGNOSIS — D649 Anemia, unspecified: Secondary | ICD-10-CM | POA: Diagnosis not present

## 2021-05-21 DIAGNOSIS — R109 Unspecified abdominal pain: Secondary | ICD-10-CM | POA: Diagnosis not present

## 2021-05-21 DIAGNOSIS — R519 Headache, unspecified: Secondary | ICD-10-CM | POA: Diagnosis not present

## 2021-05-21 DIAGNOSIS — Z91030 Bee allergy status: Secondary | ICD-10-CM | POA: Diagnosis not present

## 2021-05-24 DIAGNOSIS — D219 Benign neoplasm of connective and other soft tissue, unspecified: Secondary | ICD-10-CM | POA: Diagnosis not present

## 2021-05-26 DIAGNOSIS — D500 Iron deficiency anemia secondary to blood loss (chronic): Secondary | ICD-10-CM | POA: Diagnosis not present

## 2021-05-26 DIAGNOSIS — R339 Retention of urine, unspecified: Secondary | ICD-10-CM | POA: Diagnosis not present

## 2021-07-29 ENCOUNTER — Encounter: Payer: Self-pay | Admitting: Gastroenterology

## 2021-08-02 DIAGNOSIS — G894 Chronic pain syndrome: Secondary | ICD-10-CM | POA: Diagnosis not present

## 2021-08-02 DIAGNOSIS — M79604 Pain in right leg: Secondary | ICD-10-CM | POA: Diagnosis not present

## 2021-08-02 DIAGNOSIS — M2391 Unspecified internal derangement of right knee: Secondary | ICD-10-CM | POA: Diagnosis not present

## 2021-08-02 DIAGNOSIS — M47817 Spondylosis without myelopathy or radiculopathy, lumbosacral region: Secondary | ICD-10-CM | POA: Diagnosis not present

## 2021-08-16 DIAGNOSIS — R6889 Other general symptoms and signs: Secondary | ICD-10-CM | POA: Diagnosis not present

## 2021-08-22 DIAGNOSIS — R0683 Snoring: Secondary | ICD-10-CM | POA: Diagnosis not present

## 2021-08-22 DIAGNOSIS — G4710 Hypersomnia, unspecified: Secondary | ICD-10-CM | POA: Diagnosis not present

## 2021-09-13 DIAGNOSIS — G4710 Hypersomnia, unspecified: Secondary | ICD-10-CM | POA: Diagnosis not present

## 2021-09-13 DIAGNOSIS — R0683 Snoring: Secondary | ICD-10-CM | POA: Diagnosis not present

## 2021-10-04 DIAGNOSIS — G4710 Hypersomnia, unspecified: Secondary | ICD-10-CM | POA: Diagnosis not present

## 2021-10-05 DIAGNOSIS — G4710 Hypersomnia, unspecified: Secondary | ICD-10-CM | POA: Diagnosis not present

## 2021-10-05 DIAGNOSIS — R0683 Snoring: Secondary | ICD-10-CM | POA: Diagnosis not present

## 2021-10-05 DIAGNOSIS — G4709 Other insomnia: Secondary | ICD-10-CM | POA: Diagnosis not present

## 2021-12-12 DIAGNOSIS — G2581 Restless legs syndrome: Secondary | ICD-10-CM | POA: Diagnosis not present

## 2021-12-12 DIAGNOSIS — G43009 Migraine without aura, not intractable, without status migrainosus: Secondary | ICD-10-CM | POA: Diagnosis not present

## 2021-12-12 DIAGNOSIS — Z9109 Other allergy status, other than to drugs and biological substances: Secondary | ICD-10-CM | POA: Diagnosis not present

## 2021-12-12 DIAGNOSIS — K219 Gastro-esophageal reflux disease without esophagitis: Secondary | ICD-10-CM | POA: Diagnosis not present

## 2021-12-12 DIAGNOSIS — N3281 Overactive bladder: Secondary | ICD-10-CM | POA: Diagnosis not present

## 2021-12-12 DIAGNOSIS — R1084 Generalized abdominal pain: Secondary | ICD-10-CM | POA: Diagnosis not present

## 2021-12-12 DIAGNOSIS — Z0000 Encounter for general adult medical examination without abnormal findings: Secondary | ICD-10-CM | POA: Diagnosis not present

## 2022-01-17 DIAGNOSIS — M25561 Pain in right knee: Secondary | ICD-10-CM | POA: Diagnosis not present

## 2022-01-17 DIAGNOSIS — Z79891 Long term (current) use of opiate analgesic: Secondary | ICD-10-CM | POA: Diagnosis not present

## 2022-01-17 DIAGNOSIS — G894 Chronic pain syndrome: Secondary | ICD-10-CM | POA: Diagnosis not present

## 2022-01-17 DIAGNOSIS — M79604 Pain in right leg: Secondary | ICD-10-CM | POA: Diagnosis not present

## 2022-01-23 ENCOUNTER — Ambulatory Visit (INDEPENDENT_AMBULATORY_CARE_PROVIDER_SITE_OTHER): Payer: Medicare Other | Admitting: Diagnostic Neuroimaging

## 2022-01-23 ENCOUNTER — Encounter: Payer: Self-pay | Admitting: Diagnostic Neuroimaging

## 2022-01-23 VITALS — BP 122/82 | HR 86 | Ht 67.0 in | Wt 158.0 lb

## 2022-01-23 DIAGNOSIS — G43009 Migraine without aura, not intractable, without status migrainosus: Secondary | ICD-10-CM | POA: Diagnosis not present
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
## 2022-01-23 NOTE — Patient Instructions (Signed)
?  INSOMNIA ?- per psychiatry and pain mgmt ?- sleep hygiene reviewed (sleep routine, temperature, light, polypharmacy) ? ?MIGRAINE WITHOUT AURA ?- continue topiramate '100mg'$  twice a day  ?- continue sumatriptan '100mg'$  as needed ? ?IRON DEFICIENCY ?- follow up with PCP ?

## 2022-02-15 DIAGNOSIS — M79604 Pain in right leg: Secondary | ICD-10-CM | POA: Diagnosis not present

## 2022-02-15 DIAGNOSIS — M25561 Pain in right knee: Secondary | ICD-10-CM | POA: Diagnosis not present

## 2022-02-15 DIAGNOSIS — G8929 Other chronic pain: Secondary | ICD-10-CM | POA: Diagnosis not present

## 2022-02-15 DIAGNOSIS — M25562 Pain in left knee: Secondary | ICD-10-CM | POA: Diagnosis not present

## 2022-02-15 DIAGNOSIS — M47817 Spondylosis without myelopathy or radiculopathy, lumbosacral region: Secondary | ICD-10-CM | POA: Diagnosis not present

## 2022-03-22 DIAGNOSIS — M25562 Pain in left knee: Secondary | ICD-10-CM | POA: Diagnosis not present

## 2022-03-22 DIAGNOSIS — G8929 Other chronic pain: Secondary | ICD-10-CM | POA: Diagnosis not present

## 2022-03-22 DIAGNOSIS — Z79891 Long term (current) use of opiate analgesic: Secondary | ICD-10-CM | POA: Diagnosis not present

## 2022-03-22 DIAGNOSIS — M533 Sacrococcygeal disorders, not elsewhere classified: Secondary | ICD-10-CM | POA: Diagnosis not present

## 2022-03-22 DIAGNOSIS — G894 Chronic pain syndrome: Secondary | ICD-10-CM | POA: Diagnosis not present

## 2022-04-26 DIAGNOSIS — M533 Sacrococcygeal disorders, not elsewhere classified: Secondary | ICD-10-CM | POA: Diagnosis not present

## 2022-04-26 DIAGNOSIS — Z79891 Long term (current) use of opiate analgesic: Secondary | ICD-10-CM | POA: Diagnosis not present

## 2022-04-26 DIAGNOSIS — M25562 Pain in left knee: Secondary | ICD-10-CM | POA: Diagnosis not present

## 2022-04-26 DIAGNOSIS — G8929 Other chronic pain: Secondary | ICD-10-CM | POA: Diagnosis not present

## 2022-04-26 DIAGNOSIS — G894 Chronic pain syndrome: Secondary | ICD-10-CM | POA: Diagnosis not present

## 2022-07-20 DIAGNOSIS — Z79891 Long term (current) use of opiate analgesic: Secondary | ICD-10-CM | POA: Diagnosis not present

## 2022-07-20 DIAGNOSIS — M533 Sacrococcygeal disorders, not elsewhere classified: Secondary | ICD-10-CM | POA: Diagnosis not present

## 2022-07-20 DIAGNOSIS — G894 Chronic pain syndrome: Secondary | ICD-10-CM | POA: Diagnosis not present

## 2022-07-20 DIAGNOSIS — M25562 Pain in left knee: Secondary | ICD-10-CM | POA: Diagnosis not present

## 2022-07-20 DIAGNOSIS — G8929 Other chronic pain: Secondary | ICD-10-CM | POA: Diagnosis not present

## 2022-08-16 DIAGNOSIS — Z79891 Long term (current) use of opiate analgesic: Secondary | ICD-10-CM | POA: Diagnosis not present

## 2022-08-16 DIAGNOSIS — G894 Chronic pain syndrome: Secondary | ICD-10-CM | POA: Diagnosis not present

## 2022-08-16 DIAGNOSIS — M25562 Pain in left knee: Secondary | ICD-10-CM | POA: Diagnosis not present

## 2022-08-16 DIAGNOSIS — M25561 Pain in right knee: Secondary | ICD-10-CM | POA: Diagnosis not present

## 2022-08-16 DIAGNOSIS — M533 Sacrococcygeal disorders, not elsewhere classified: Secondary | ICD-10-CM | POA: Diagnosis not present

## 2022-08-16 DIAGNOSIS — M79604 Pain in right leg: Secondary | ICD-10-CM | POA: Diagnosis not present

## 2022-08-28 DIAGNOSIS — Z1231 Encounter for screening mammogram for malignant neoplasm of breast: Secondary | ICD-10-CM | POA: Diagnosis not present

## 2022-08-28 DIAGNOSIS — R682 Dry mouth, unspecified: Secondary | ICD-10-CM | POA: Diagnosis not present

## 2022-08-28 DIAGNOSIS — M25511 Pain in right shoulder: Secondary | ICD-10-CM | POA: Diagnosis not present

## 2022-08-28 DIAGNOSIS — D500 Iron deficiency anemia secondary to blood loss (chronic): Secondary | ICD-10-CM | POA: Diagnosis not present

## 2022-08-28 DIAGNOSIS — D649 Anemia, unspecified: Secondary | ICD-10-CM | POA: Diagnosis not present

## 2022-08-28 DIAGNOSIS — M79604 Pain in right leg: Secondary | ICD-10-CM | POA: Diagnosis not present

## 2022-08-28 DIAGNOSIS — M25512 Pain in left shoulder: Secondary | ICD-10-CM | POA: Diagnosis not present

## 2022-08-28 DIAGNOSIS — M79671 Pain in right foot: Secondary | ICD-10-CM | POA: Diagnosis not present

## 2022-08-28 DIAGNOSIS — H04123 Dry eye syndrome of bilateral lacrimal glands: Secondary | ICD-10-CM | POA: Diagnosis not present

## 2022-08-28 DIAGNOSIS — M79672 Pain in left foot: Secondary | ICD-10-CM | POA: Diagnosis not present

## 2022-08-28 DIAGNOSIS — Z0000 Encounter for general adult medical examination without abnormal findings: Secondary | ICD-10-CM | POA: Diagnosis not present

## 2022-08-28 DIAGNOSIS — M79605 Pain in left leg: Secondary | ICD-10-CM | POA: Diagnosis not present

## 2022-08-30 DIAGNOSIS — M533 Sacrococcygeal disorders, not elsewhere classified: Secondary | ICD-10-CM | POA: Diagnosis not present

## 2022-09-27 DIAGNOSIS — M533 Sacrococcygeal disorders, not elsewhere classified: Secondary | ICD-10-CM | POA: Diagnosis not present

## 2022-09-27 DIAGNOSIS — G894 Chronic pain syndrome: Secondary | ICD-10-CM | POA: Diagnosis not present

## 2022-09-27 DIAGNOSIS — Z79891 Long term (current) use of opiate analgesic: Secondary | ICD-10-CM | POA: Diagnosis not present

## 2022-09-27 DIAGNOSIS — M79604 Pain in right leg: Secondary | ICD-10-CM | POA: Diagnosis not present

## 2022-09-27 DIAGNOSIS — M25561 Pain in right knee: Secondary | ICD-10-CM | POA: Diagnosis not present

## 2022-09-27 DIAGNOSIS — G8929 Other chronic pain: Secondary | ICD-10-CM | POA: Diagnosis not present

## 2022-09-27 DIAGNOSIS — M25531 Pain in right wrist: Secondary | ICD-10-CM | POA: Diagnosis not present

## 2022-09-27 DIAGNOSIS — M25562 Pain in left knee: Secondary | ICD-10-CM | POA: Diagnosis not present

## 2022-11-01 DIAGNOSIS — M533 Sacrococcygeal disorders, not elsewhere classified: Secondary | ICD-10-CM | POA: Diagnosis not present

## 2022-11-01 DIAGNOSIS — M79604 Pain in right leg: Secondary | ICD-10-CM | POA: Diagnosis not present

## 2022-11-01 DIAGNOSIS — M25562 Pain in left knee: Secondary | ICD-10-CM | POA: Diagnosis not present

## 2022-11-01 DIAGNOSIS — G894 Chronic pain syndrome: Secondary | ICD-10-CM | POA: Diagnosis not present

## 2022-11-01 DIAGNOSIS — Z79891 Long term (current) use of opiate analgesic: Secondary | ICD-10-CM | POA: Diagnosis not present

## 2022-11-01 DIAGNOSIS — M25531 Pain in right wrist: Secondary | ICD-10-CM | POA: Diagnosis not present

## 2022-11-01 DIAGNOSIS — M25561 Pain in right knee: Secondary | ICD-10-CM | POA: Diagnosis not present

## 2022-11-01 DIAGNOSIS — G8929 Other chronic pain: Secondary | ICD-10-CM | POA: Diagnosis not present

## 2022-12-13 DIAGNOSIS — G8929 Other chronic pain: Secondary | ICD-10-CM | POA: Diagnosis not present

## 2022-12-13 DIAGNOSIS — M79604 Pain in right leg: Secondary | ICD-10-CM | POA: Diagnosis not present

## 2022-12-13 DIAGNOSIS — Z79891 Long term (current) use of opiate analgesic: Secondary | ICD-10-CM | POA: Diagnosis not present

## 2022-12-13 DIAGNOSIS — M533 Sacrococcygeal disorders, not elsewhere classified: Secondary | ICD-10-CM | POA: Diagnosis not present

## 2022-12-13 DIAGNOSIS — M25561 Pain in right knee: Secondary | ICD-10-CM | POA: Diagnosis not present

## 2022-12-13 DIAGNOSIS — M25562 Pain in left knee: Secondary | ICD-10-CM | POA: Diagnosis not present

## 2022-12-13 DIAGNOSIS — M25531 Pain in right wrist: Secondary | ICD-10-CM | POA: Diagnosis not present

## 2022-12-13 DIAGNOSIS — G894 Chronic pain syndrome: Secondary | ICD-10-CM | POA: Diagnosis not present

## 2023-02-08 DIAGNOSIS — H04123 Dry eye syndrome of bilateral lacrimal glands: Secondary | ICD-10-CM | POA: Diagnosis not present

## 2023-02-08 DIAGNOSIS — D500 Iron deficiency anemia secondary to blood loss (chronic): Secondary | ICD-10-CM | POA: Diagnosis not present

## 2023-02-08 DIAGNOSIS — R682 Dry mouth, unspecified: Secondary | ICD-10-CM | POA: Diagnosis not present

## 2023-02-08 DIAGNOSIS — M79605 Pain in left leg: Secondary | ICD-10-CM | POA: Diagnosis not present

## 2023-02-08 DIAGNOSIS — M79672 Pain in left foot: Secondary | ICD-10-CM | POA: Diagnosis not present

## 2023-02-08 DIAGNOSIS — D649 Anemia, unspecified: Secondary | ICD-10-CM | POA: Diagnosis not present

## 2023-02-08 DIAGNOSIS — H9202 Otalgia, left ear: Secondary | ICD-10-CM | POA: Diagnosis not present

## 2023-02-08 DIAGNOSIS — M79671 Pain in right foot: Secondary | ICD-10-CM | POA: Diagnosis not present

## 2023-02-08 DIAGNOSIS — M79604 Pain in right leg: Secondary | ICD-10-CM | POA: Diagnosis not present

## 2023-02-08 DIAGNOSIS — Z0000 Encounter for general adult medical examination without abnormal findings: Secondary | ICD-10-CM | POA: Diagnosis not present

## 2023-02-15 DIAGNOSIS — M79604 Pain in right leg: Secondary | ICD-10-CM | POA: Diagnosis not present

## 2023-02-15 DIAGNOSIS — M533 Sacrococcygeal disorders, not elsewhere classified: Secondary | ICD-10-CM | POA: Diagnosis not present

## 2023-02-15 DIAGNOSIS — M25531 Pain in right wrist: Secondary | ICD-10-CM | POA: Diagnosis not present

## 2023-02-15 DIAGNOSIS — Z79891 Long term (current) use of opiate analgesic: Secondary | ICD-10-CM | POA: Diagnosis not present

## 2023-02-15 DIAGNOSIS — M25562 Pain in left knee: Secondary | ICD-10-CM | POA: Diagnosis not present

## 2023-02-15 DIAGNOSIS — G894 Chronic pain syndrome: Secondary | ICD-10-CM | POA: Diagnosis not present

## 2023-02-15 DIAGNOSIS — M25511 Pain in right shoulder: Secondary | ICD-10-CM | POA: Diagnosis not present

## 2023-02-15 DIAGNOSIS — M25512 Pain in left shoulder: Secondary | ICD-10-CM | POA: Diagnosis not present

## 2023-02-15 DIAGNOSIS — M25561 Pain in right knee: Secondary | ICD-10-CM | POA: Diagnosis not present

## 2023-03-12 DIAGNOSIS — M2041 Other hammer toe(s) (acquired), right foot: Secondary | ICD-10-CM | POA: Diagnosis not present

## 2023-03-12 DIAGNOSIS — M722 Plantar fascial fibromatosis: Secondary | ICD-10-CM | POA: Diagnosis not present

## 2023-03-12 DIAGNOSIS — M2042 Other hammer toe(s) (acquired), left foot: Secondary | ICD-10-CM | POA: Diagnosis not present

## 2023-03-12 DIAGNOSIS — M792 Neuralgia and neuritis, unspecified: Secondary | ICD-10-CM | POA: Diagnosis not present

## 2023-03-12 DIAGNOSIS — R52 Pain, unspecified: Secondary | ICD-10-CM | POA: Diagnosis not present

## 2023-03-21 DIAGNOSIS — M25531 Pain in right wrist: Secondary | ICD-10-CM | POA: Diagnosis not present

## 2023-03-21 DIAGNOSIS — M25562 Pain in left knee: Secondary | ICD-10-CM | POA: Diagnosis not present

## 2023-03-21 DIAGNOSIS — Z79891 Long term (current) use of opiate analgesic: Secondary | ICD-10-CM | POA: Diagnosis not present

## 2023-03-21 DIAGNOSIS — M25561 Pain in right knee: Secondary | ICD-10-CM | POA: Diagnosis not present

## 2023-03-21 DIAGNOSIS — M25512 Pain in left shoulder: Secondary | ICD-10-CM | POA: Diagnosis not present

## 2023-03-21 DIAGNOSIS — G894 Chronic pain syndrome: Secondary | ICD-10-CM | POA: Diagnosis not present

## 2023-03-21 DIAGNOSIS — M79604 Pain in right leg: Secondary | ICD-10-CM | POA: Diagnosis not present

## 2023-03-21 DIAGNOSIS — M25511 Pain in right shoulder: Secondary | ICD-10-CM | POA: Diagnosis not present

## 2023-03-21 DIAGNOSIS — M533 Sacrococcygeal disorders, not elsewhere classified: Secondary | ICD-10-CM | POA: Diagnosis not present

## 2023-05-17 DIAGNOSIS — M47817 Spondylosis without myelopathy or radiculopathy, lumbosacral region: Secondary | ICD-10-CM | POA: Diagnosis not present

## 2023-05-17 DIAGNOSIS — Z79891 Long term (current) use of opiate analgesic: Secondary | ICD-10-CM | POA: Diagnosis not present

## 2023-05-17 DIAGNOSIS — M25511 Pain in right shoulder: Secondary | ICD-10-CM | POA: Diagnosis not present

## 2023-05-17 DIAGNOSIS — M533 Sacrococcygeal disorders, not elsewhere classified: Secondary | ICD-10-CM | POA: Diagnosis not present

## 2023-05-17 DIAGNOSIS — M25531 Pain in right wrist: Secondary | ICD-10-CM | POA: Diagnosis not present

## 2023-05-17 DIAGNOSIS — M79604 Pain in right leg: Secondary | ICD-10-CM | POA: Diagnosis not present

## 2023-05-17 DIAGNOSIS — M25562 Pain in left knee: Secondary | ICD-10-CM | POA: Diagnosis not present

## 2023-05-17 DIAGNOSIS — M25512 Pain in left shoulder: Secondary | ICD-10-CM | POA: Diagnosis not present

## 2023-05-17 DIAGNOSIS — M25561 Pain in right knee: Secondary | ICD-10-CM | POA: Diagnosis not present

## 2023-06-14 DIAGNOSIS — M47817 Spondylosis without myelopathy or radiculopathy, lumbosacral region: Secondary | ICD-10-CM | POA: Diagnosis not present

## 2023-06-14 DIAGNOSIS — G629 Polyneuropathy, unspecified: Secondary | ICD-10-CM | POA: Diagnosis not present

## 2023-06-14 DIAGNOSIS — M25561 Pain in right knee: Secondary | ICD-10-CM | POA: Diagnosis not present

## 2023-06-14 DIAGNOSIS — M533 Sacrococcygeal disorders, not elsewhere classified: Secondary | ICD-10-CM | POA: Diagnosis not present

## 2023-06-14 DIAGNOSIS — Z79891 Long term (current) use of opiate analgesic: Secondary | ICD-10-CM | POA: Diagnosis not present

## 2023-06-14 DIAGNOSIS — M25511 Pain in right shoulder: Secondary | ICD-10-CM | POA: Diagnosis not present

## 2023-06-14 DIAGNOSIS — M25512 Pain in left shoulder: Secondary | ICD-10-CM | POA: Diagnosis not present

## 2023-06-14 DIAGNOSIS — M25531 Pain in right wrist: Secondary | ICD-10-CM | POA: Diagnosis not present

## 2023-06-14 DIAGNOSIS — M25562 Pain in left knee: Secondary | ICD-10-CM | POA: Diagnosis not present

## 2023-06-14 DIAGNOSIS — M79604 Pain in right leg: Secondary | ICD-10-CM | POA: Diagnosis not present

## 2023-07-12 DIAGNOSIS — M2550 Pain in unspecified joint: Secondary | ICD-10-CM | POA: Diagnosis not present

## 2023-07-12 DIAGNOSIS — G629 Polyneuropathy, unspecified: Secondary | ICD-10-CM | POA: Diagnosis not present

## 2023-07-12 DIAGNOSIS — M25531 Pain in right wrist: Secondary | ICD-10-CM | POA: Diagnosis not present

## 2023-07-12 DIAGNOSIS — M47817 Spondylosis without myelopathy or radiculopathy, lumbosacral region: Secondary | ICD-10-CM | POA: Diagnosis not present

## 2023-07-12 DIAGNOSIS — M25512 Pain in left shoulder: Secondary | ICD-10-CM | POA: Diagnosis not present

## 2023-07-12 DIAGNOSIS — M533 Sacrococcygeal disorders, not elsewhere classified: Secondary | ICD-10-CM | POA: Diagnosis not present

## 2023-07-12 DIAGNOSIS — M25561 Pain in right knee: Secondary | ICD-10-CM | POA: Diagnosis not present

## 2023-07-12 DIAGNOSIS — M25511 Pain in right shoulder: Secondary | ICD-10-CM | POA: Diagnosis not present

## 2023-07-12 DIAGNOSIS — M25562 Pain in left knee: Secondary | ICD-10-CM | POA: Diagnosis not present

## 2023-07-26 DIAGNOSIS — M79672 Pain in left foot: Secondary | ICD-10-CM | POA: Diagnosis not present

## 2023-08-09 DIAGNOSIS — M47817 Spondylosis without myelopathy or radiculopathy, lumbosacral region: Secondary | ICD-10-CM | POA: Diagnosis not present

## 2023-08-09 DIAGNOSIS — M25531 Pain in right wrist: Secondary | ICD-10-CM | POA: Diagnosis not present

## 2023-08-09 DIAGNOSIS — M25511 Pain in right shoulder: Secondary | ICD-10-CM | POA: Diagnosis not present

## 2023-08-09 DIAGNOSIS — M2550 Pain in unspecified joint: Secondary | ICD-10-CM | POA: Diagnosis not present

## 2023-08-09 DIAGNOSIS — M25562 Pain in left knee: Secondary | ICD-10-CM | POA: Diagnosis not present

## 2023-08-09 DIAGNOSIS — G629 Polyneuropathy, unspecified: Secondary | ICD-10-CM | POA: Diagnosis not present

## 2023-08-09 DIAGNOSIS — M533 Sacrococcygeal disorders, not elsewhere classified: Secondary | ICD-10-CM | POA: Diagnosis not present

## 2023-08-09 DIAGNOSIS — M25561 Pain in right knee: Secondary | ICD-10-CM | POA: Diagnosis not present

## 2023-08-09 DIAGNOSIS — M25512 Pain in left shoulder: Secondary | ICD-10-CM | POA: Diagnosis not present

## 2023-09-05 DIAGNOSIS — Z8781 Personal history of (healed) traumatic fracture: Secondary | ICD-10-CM | POA: Diagnosis not present

## 2023-09-05 DIAGNOSIS — M25512 Pain in left shoulder: Secondary | ICD-10-CM | POA: Diagnosis not present

## 2023-09-05 DIAGNOSIS — M25511 Pain in right shoulder: Secondary | ICD-10-CM | POA: Diagnosis not present

## 2023-09-06 DIAGNOSIS — G629 Polyneuropathy, unspecified: Secondary | ICD-10-CM | POA: Diagnosis not present

## 2023-09-06 DIAGNOSIS — M25531 Pain in right wrist: Secondary | ICD-10-CM | POA: Diagnosis not present

## 2023-09-06 DIAGNOSIS — M25511 Pain in right shoulder: Secondary | ICD-10-CM | POA: Diagnosis not present

## 2023-09-06 DIAGNOSIS — M79671 Pain in right foot: Secondary | ICD-10-CM | POA: Diagnosis not present

## 2023-09-06 DIAGNOSIS — M25532 Pain in left wrist: Secondary | ICD-10-CM | POA: Diagnosis not present

## 2023-09-06 DIAGNOSIS — M2550 Pain in unspecified joint: Secondary | ICD-10-CM | POA: Diagnosis not present

## 2023-09-06 DIAGNOSIS — M47817 Spondylosis without myelopathy or radiculopathy, lumbosacral region: Secondary | ICD-10-CM | POA: Diagnosis not present

## 2023-09-06 DIAGNOSIS — M25512 Pain in left shoulder: Secondary | ICD-10-CM | POA: Diagnosis not present

## 2023-09-06 DIAGNOSIS — M25562 Pain in left knee: Secondary | ICD-10-CM | POA: Diagnosis not present

## 2023-11-01 DIAGNOSIS — M79671 Pain in right foot: Secondary | ICD-10-CM | POA: Diagnosis not present

## 2023-11-01 DIAGNOSIS — M47817 Spondylosis without myelopathy or radiculopathy, lumbosacral region: Secondary | ICD-10-CM | POA: Diagnosis not present

## 2023-11-01 DIAGNOSIS — M25511 Pain in right shoulder: Secondary | ICD-10-CM | POA: Diagnosis not present

## 2023-11-01 DIAGNOSIS — M2550 Pain in unspecified joint: Secondary | ICD-10-CM | POA: Diagnosis not present

## 2023-11-01 DIAGNOSIS — M25531 Pain in right wrist: Secondary | ICD-10-CM | POA: Diagnosis not present

## 2023-11-01 DIAGNOSIS — M25532 Pain in left wrist: Secondary | ICD-10-CM | POA: Diagnosis not present

## 2023-11-01 DIAGNOSIS — M25512 Pain in left shoulder: Secondary | ICD-10-CM | POA: Diagnosis not present

## 2023-11-01 DIAGNOSIS — Z79891 Long term (current) use of opiate analgesic: Secondary | ICD-10-CM | POA: Diagnosis not present

## 2023-11-01 DIAGNOSIS — M79672 Pain in left foot: Secondary | ICD-10-CM | POA: Diagnosis not present

## 2023-11-01 DIAGNOSIS — G629 Polyneuropathy, unspecified: Secondary | ICD-10-CM | POA: Diagnosis not present

## 2023-11-29 DIAGNOSIS — M25511 Pain in right shoulder: Secondary | ICD-10-CM | POA: Diagnosis not present

## 2023-11-29 DIAGNOSIS — M25512 Pain in left shoulder: Secondary | ICD-10-CM | POA: Diagnosis not present

## 2023-11-29 DIAGNOSIS — M25562 Pain in left knee: Secondary | ICD-10-CM | POA: Diagnosis not present

## 2023-11-29 DIAGNOSIS — M47817 Spondylosis without myelopathy or radiculopathy, lumbosacral region: Secondary | ICD-10-CM | POA: Diagnosis not present

## 2023-11-29 DIAGNOSIS — M25532 Pain in left wrist: Secondary | ICD-10-CM | POA: Diagnosis not present

## 2023-11-29 DIAGNOSIS — M25561 Pain in right knee: Secondary | ICD-10-CM | POA: Diagnosis not present

## 2023-11-29 DIAGNOSIS — M25531 Pain in right wrist: Secondary | ICD-10-CM | POA: Diagnosis not present

## 2023-11-29 DIAGNOSIS — G629 Polyneuropathy, unspecified: Secondary | ICD-10-CM | POA: Diagnosis not present

## 2023-11-29 DIAGNOSIS — M79671 Pain in right foot: Secondary | ICD-10-CM | POA: Diagnosis not present

## 2023-12-26 DIAGNOSIS — M25532 Pain in left wrist: Secondary | ICD-10-CM | POA: Diagnosis not present

## 2023-12-26 DIAGNOSIS — G629 Polyneuropathy, unspecified: Secondary | ICD-10-CM | POA: Diagnosis not present

## 2023-12-26 DIAGNOSIS — M47817 Spondylosis without myelopathy or radiculopathy, lumbosacral region: Secondary | ICD-10-CM | POA: Diagnosis not present

## 2023-12-26 DIAGNOSIS — M79672 Pain in left foot: Secondary | ICD-10-CM | POA: Diagnosis not present

## 2023-12-26 DIAGNOSIS — M2550 Pain in unspecified joint: Secondary | ICD-10-CM | POA: Diagnosis not present

## 2023-12-26 DIAGNOSIS — M25531 Pain in right wrist: Secondary | ICD-10-CM | POA: Diagnosis not present

## 2023-12-26 DIAGNOSIS — M25511 Pain in right shoulder: Secondary | ICD-10-CM | POA: Diagnosis not present

## 2023-12-26 DIAGNOSIS — M25512 Pain in left shoulder: Secondary | ICD-10-CM | POA: Diagnosis not present

## 2023-12-26 DIAGNOSIS — M79671 Pain in right foot: Secondary | ICD-10-CM | POA: Diagnosis not present

## 2024-01-11 DIAGNOSIS — G8929 Other chronic pain: Secondary | ICD-10-CM | POA: Diagnosis not present

## 2024-01-11 DIAGNOSIS — S2241XA Multiple fractures of ribs, right side, initial encounter for closed fracture: Secondary | ICD-10-CM | POA: Diagnosis not present

## 2024-01-11 DIAGNOSIS — E079 Disorder of thyroid, unspecified: Secondary | ICD-10-CM | POA: Diagnosis not present

## 2024-01-11 DIAGNOSIS — S2241XD Multiple fractures of ribs, right side, subsequent encounter for fracture with routine healing: Secondary | ICD-10-CM | POA: Diagnosis not present

## 2024-01-11 DIAGNOSIS — M546 Pain in thoracic spine: Secondary | ICD-10-CM | POA: Diagnosis not present

## 2024-01-11 DIAGNOSIS — M47817 Spondylosis without myelopathy or radiculopathy, lumbosacral region: Secondary | ICD-10-CM | POA: Diagnosis not present

## 2024-01-11 DIAGNOSIS — M25531 Pain in right wrist: Secondary | ICD-10-CM | POA: Diagnosis not present

## 2024-01-11 DIAGNOSIS — M79673 Pain in unspecified foot: Secondary | ICD-10-CM | POA: Diagnosis not present

## 2024-01-11 DIAGNOSIS — R0781 Pleurodynia: Secondary | ICD-10-CM | POA: Diagnosis not present

## 2024-01-11 DIAGNOSIS — L989 Disorder of the skin and subcutaneous tissue, unspecified: Secondary | ICD-10-CM | POA: Diagnosis not present

## 2024-01-11 DIAGNOSIS — M25532 Pain in left wrist: Secondary | ICD-10-CM | POA: Diagnosis not present

## 2024-01-11 DIAGNOSIS — M47812 Spondylosis without myelopathy or radiculopathy, cervical region: Secondary | ICD-10-CM | POA: Diagnosis not present
